# Patient Record
Sex: Female | Born: 1950 | Race: White | Hispanic: No | Smoking: Never smoker
Health system: Southern US, Community
[De-identification: ages and names within clinical notes are randomized; demographics above are authoritative.]

## PROBLEM LIST (undated history)

## (undated) DIAGNOSIS — G8929 Other chronic pain: Secondary | ICD-10-CM

## (undated) DIAGNOSIS — C4492 Squamous cell carcinoma of skin, unspecified: Secondary | ICD-10-CM

## (undated) DIAGNOSIS — M1990 Unspecified osteoarthritis, unspecified site: Secondary | ICD-10-CM

## (undated) DIAGNOSIS — Z973 Presence of spectacles and contact lenses: Secondary | ICD-10-CM

## (undated) DIAGNOSIS — J309 Allergic rhinitis, unspecified: Secondary | ICD-10-CM

## (undated) DIAGNOSIS — K219 Gastro-esophageal reflux disease without esophagitis: Secondary | ICD-10-CM

## (undated) DIAGNOSIS — I10 Essential (primary) hypertension: Secondary | ICD-10-CM

## (undated) DIAGNOSIS — M5135 Other intervertebral disc degeneration, thoracolumbar region: Secondary | ICD-10-CM

## (undated) DIAGNOSIS — G4733 Obstructive sleep apnea (adult) (pediatric): Secondary | ICD-10-CM

## (undated) DIAGNOSIS — G5132 Clonic hemifacial spasm, left: Secondary | ICD-10-CM

## (undated) DIAGNOSIS — E611 Iron deficiency: Secondary | ICD-10-CM

## (undated) DIAGNOSIS — Z8619 Personal history of other infectious and parasitic diseases: Secondary | ICD-10-CM

## (undated) DIAGNOSIS — Z87442 Personal history of urinary calculi: Secondary | ICD-10-CM

## (undated) DIAGNOSIS — E538 Deficiency of other specified B group vitamins: Secondary | ICD-10-CM

## (undated) DIAGNOSIS — D229 Melanocytic nevi, unspecified: Secondary | ICD-10-CM

## (undated) DIAGNOSIS — N200 Calculus of kidney: Secondary | ICD-10-CM

## (undated) HISTORY — PX: GASTRIC BYPASS: SHX52

## (undated) HISTORY — PX: REPLACEMENT TOTAL KNEE: SUR1224

## (undated) HISTORY — DX: Essential (primary) hypertension: I10

## (undated) HISTORY — DX: Squamous cell carcinoma of skin, unspecified: C44.92

## (undated) HISTORY — PX: KNEE ARTHROSCOPY: SUR90

## (undated) HISTORY — PX: BOTOX INJECTION: SHX5754

## (undated) HISTORY — PX: TOTAL KNEE ARTHROPLASTY: SHX125

## (undated) HISTORY — PX: ABDOMINAL HYSTERECTOMY: SHX81

## (undated) HISTORY — DX: Melanocytic nevi, unspecified: D22.9

## (undated) HISTORY — DX: Other chronic pain: G89.29

## (undated) HISTORY — PX: ANKLE SURGERY: SHX546

## (undated) HISTORY — DX: Unspecified osteoarthritis, unspecified site: M19.90

## (undated) HISTORY — PX: FACIAL COSMETIC SURGERY: SHX629

## (undated) HISTORY — DX: Personal history of other infectious and parasitic diseases: Z86.19

## (undated) HISTORY — DX: Deficiency of other specified B group vitamins: E53.8

---

## 1986-10-02 HISTORY — PX: TOTAL VAGINAL HYSTERECTOMY: SHX2548

## 1998-04-09 ENCOUNTER — Other Ambulatory Visit: Admission: RE | Admit: 1998-04-09 | Discharge: 1998-04-09 | Payer: Self-pay | Admitting: Obstetrics and Gynecology

## 1999-09-15 ENCOUNTER — Other Ambulatory Visit: Admission: RE | Admit: 1999-09-15 | Discharge: 1999-09-15 | Payer: Self-pay | Admitting: Obstetrics and Gynecology

## 2000-08-22 ENCOUNTER — Encounter: Payer: Self-pay | Admitting: Obstetrics and Gynecology

## 2000-08-22 ENCOUNTER — Encounter: Admission: RE | Admit: 2000-08-22 | Discharge: 2000-08-22 | Payer: Self-pay | Admitting: Obstetrics and Gynecology

## 2000-09-20 ENCOUNTER — Other Ambulatory Visit: Admission: RE | Admit: 2000-09-20 | Discharge: 2000-09-20 | Payer: Self-pay | Admitting: Obstetrics and Gynecology

## 2002-02-20 ENCOUNTER — Other Ambulatory Visit: Admission: RE | Admit: 2002-02-20 | Discharge: 2002-02-20 | Payer: Self-pay | Admitting: Obstetrics and Gynecology

## 2002-07-07 ENCOUNTER — Encounter: Admission: RE | Admit: 2002-07-07 | Discharge: 2002-07-07 | Payer: Self-pay | Admitting: *Deleted

## 2002-07-08 ENCOUNTER — Encounter: Admission: RE | Admit: 2002-07-08 | Discharge: 2002-07-15 | Payer: Self-pay | Admitting: *Deleted

## 2002-07-09 ENCOUNTER — Ambulatory Visit (HOSPITAL_BASED_OUTPATIENT_CLINIC_OR_DEPARTMENT_OTHER): Admission: RE | Admit: 2002-07-09 | Discharge: 2002-07-09 | Payer: Self-pay | Admitting: *Deleted

## 2002-08-15 ENCOUNTER — Encounter: Admission: RE | Admit: 2002-08-15 | Discharge: 2002-11-13 | Payer: Self-pay | Admitting: *Deleted

## 2002-08-25 ENCOUNTER — Inpatient Hospital Stay (HOSPITAL_COMMUNITY): Admission: RE | Admit: 2002-08-25 | Discharge: 2002-08-29 | Payer: Self-pay | Admitting: *Deleted

## 2002-08-25 DIAGNOSIS — Z9884 Bariatric surgery status: Secondary | ICD-10-CM

## 2002-08-25 HISTORY — DX: Bariatric surgery status: Z98.84

## 2002-08-25 HISTORY — PX: ROUX-EN-Y GASTRIC BYPASS: SHX1104

## 2003-03-03 ENCOUNTER — Encounter: Admission: RE | Admit: 2003-03-03 | Discharge: 2003-03-03 | Payer: Self-pay | Admitting: *Deleted

## 2003-03-05 ENCOUNTER — Encounter: Admission: RE | Admit: 2003-03-05 | Discharge: 2003-03-05 | Payer: Self-pay | Admitting: Geriatric Medicine

## 2003-03-05 ENCOUNTER — Encounter: Payer: Self-pay | Admitting: Geriatric Medicine

## 2003-10-03 HISTORY — PX: FACIAL COSMETIC SURGERY: SHX629

## 2004-04-20 ENCOUNTER — Encounter (INDEPENDENT_AMBULATORY_CARE_PROVIDER_SITE_OTHER): Payer: Self-pay | Admitting: *Deleted

## 2004-04-20 ENCOUNTER — Ambulatory Visit (HOSPITAL_COMMUNITY): Admission: RE | Admit: 2004-04-20 | Discharge: 2004-04-20 | Payer: Self-pay | Admitting: *Deleted

## 2004-04-20 HISTORY — PX: BREAST MASS EXCISION: SHX1267

## 2004-07-15 ENCOUNTER — Ambulatory Visit (HOSPITAL_BASED_OUTPATIENT_CLINIC_OR_DEPARTMENT_OTHER): Admission: RE | Admit: 2004-07-15 | Discharge: 2004-07-15 | Payer: Self-pay | Admitting: *Deleted

## 2004-07-15 ENCOUNTER — Encounter (INDEPENDENT_AMBULATORY_CARE_PROVIDER_SITE_OTHER): Payer: Self-pay | Admitting: *Deleted

## 2004-07-15 ENCOUNTER — Ambulatory Visit (HOSPITAL_COMMUNITY): Admission: RE | Admit: 2004-07-15 | Discharge: 2004-07-15 | Payer: Self-pay | Admitting: *Deleted

## 2004-07-15 HISTORY — PX: EXCISION OF BREAST LESION: SHX6676

## 2005-02-13 ENCOUNTER — Ambulatory Visit (HOSPITAL_COMMUNITY): Admission: RE | Admit: 2005-02-13 | Discharge: 2005-02-13 | Payer: Self-pay | Admitting: Plastic Surgery

## 2005-03-28 ENCOUNTER — Inpatient Hospital Stay (HOSPITAL_COMMUNITY): Admission: EM | Admit: 2005-03-28 | Discharge: 2005-03-29 | Payer: Self-pay | Admitting: Emergency Medicine

## 2005-03-28 HISTORY — PX: ABDOMINOPLASTY: SUR9

## 2005-03-28 HISTORY — PX: EXPLORATORY LAPAROTOMY: SUR591

## 2006-11-22 ENCOUNTER — Encounter: Admission: RE | Admit: 2006-11-22 | Discharge: 2006-11-22 | Payer: Self-pay | Admitting: Obstetrics and Gynecology

## 2008-04-22 ENCOUNTER — Encounter: Admission: RE | Admit: 2008-04-22 | Discharge: 2008-04-22 | Payer: Self-pay | Admitting: Specialist

## 2008-06-24 DIAGNOSIS — D229 Melanocytic nevi, unspecified: Secondary | ICD-10-CM

## 2008-06-24 HISTORY — DX: Melanocytic nevi, unspecified: D22.9

## 2008-07-20 ENCOUNTER — Encounter: Admission: RE | Admit: 2008-07-20 | Discharge: 2008-07-20 | Payer: Self-pay | Admitting: Geriatric Medicine

## 2009-08-31 ENCOUNTER — Inpatient Hospital Stay (HOSPITAL_COMMUNITY): Admission: RE | Admit: 2009-08-31 | Discharge: 2009-09-03 | Payer: Self-pay | Admitting: Specialist

## 2009-11-02 ENCOUNTER — Encounter: Admission: RE | Admit: 2009-11-02 | Discharge: 2009-11-02 | Payer: Self-pay | Admitting: Geriatric Medicine

## 2010-08-12 ENCOUNTER — Inpatient Hospital Stay (HOSPITAL_COMMUNITY): Admission: RE | Admit: 2010-08-12 | Discharge: 2010-08-15 | Payer: Self-pay | Admitting: Specialist

## 2010-10-23 ENCOUNTER — Encounter: Payer: Self-pay | Admitting: Geriatric Medicine

## 2010-10-24 ENCOUNTER — Encounter: Payer: Self-pay | Admitting: Geriatric Medicine

## 2010-12-13 LAB — CBC
HCT: 28.8 % — ABNORMAL LOW (ref 36.0–46.0)
HCT: 40.7 % (ref 36.0–46.0)
Hemoglobin: 10.3 g/dL — ABNORMAL LOW (ref 12.0–15.0)
Hemoglobin: 13.7 g/dL (ref 12.0–15.0)
Hemoglobin: 9.9 g/dL — ABNORMAL LOW (ref 12.0–15.0)
MCH: 29.8 pg (ref 26.0–34.0)
MCH: 30.1 pg (ref 26.0–34.0)
MCHC: 34.1 g/dL (ref 30.0–36.0)
MCHC: 34.3 g/dL (ref 30.0–36.0)
MCHC: 34.9 g/dL (ref 30.0–36.0)
MCV: 87.8 fL (ref 78.0–100.0)
MCV: 87.9 fL (ref 78.0–100.0)
Platelets: 146 10*3/uL — ABNORMAL LOW (ref 150–400)
Platelets: 222 10*3/uL (ref 150–400)
RBC: 3.28 MIL/uL — ABNORMAL LOW (ref 3.87–5.11)
RBC: 3.35 MIL/uL — ABNORMAL LOW (ref 3.87–5.11)
RBC: 4.6 MIL/uL (ref 3.87–5.11)
RDW: 13.5 % (ref 11.5–15.5)
RDW: 13.6 % (ref 11.5–15.5)
RDW: 13.8 % (ref 11.5–15.5)
WBC: 10.9 10*3/uL — ABNORMAL HIGH (ref 4.0–10.5)
WBC: 6.2 10*3/uL (ref 4.0–10.5)
WBC: 6.9 10*3/uL (ref 4.0–10.5)

## 2010-12-13 LAB — BASIC METABOLIC PANEL
BUN: 6 mg/dL (ref 6–23)
Calcium: 8.2 mg/dL — ABNORMAL LOW (ref 8.4–10.5)
Creatinine, Ser: 0.78 mg/dL (ref 0.4–1.2)
GFR calc Af Amer: >60 mL/min (ref >60)
GFR calc Af Amer: >60 mL/min (ref >60)
GFR calc non Af Amer: >60 mL/min (ref >60)
Potassium: 3.7 mEq/L (ref 3.5–5.1)
Potassium: 4.3 mEq/L (ref 3.5–5.1)
Sodium: 136 mEq/L (ref 135–145)

## 2010-12-13 LAB — URINALYSIS, ROUTINE W REFLEX MICROSCOPIC
Glucose, UA: NEGATIVE mg/dL
Hgb urine dipstick: NEGATIVE
Protein, ur: NEGATIVE mg/dL
pH: 5.5 (ref 5.0–8.0)

## 2010-12-13 LAB — CROSSMATCH
ABO/RH(D): O POS
Unit division: 0

## 2010-12-13 LAB — SURGICAL PCR SCREEN: MRSA, PCR: NEGATIVE

## 2010-12-13 LAB — COMPREHENSIVE METABOLIC PANEL
BUN: 13 mg/dL (ref 6–23)
CO2: 29 mEq/L (ref 19–32)
Calcium: 8.6 mg/dL (ref 8.4–10.5)
Chloride: 107 mEq/L (ref 96–112)
Creatinine, Ser: 0.82 mg/dL (ref 0.4–1.2)
GFR calc non Af Amer: >60 mL/min (ref >60)
Glucose, Bld: 74 mg/dL (ref 70–99)
Total Bilirubin: 0.4 mg/dL (ref 0.3–1.2)

## 2010-12-13 LAB — PROTIME-INR
INR: 0.91 (ref 0.00–1.49)
Prothrombin Time: 12.5 seconds (ref 11.6–15.2)

## 2010-12-13 LAB — DIFFERENTIAL
Basophils Relative: 1 % (ref 0–1)
Eosinophils Relative: 5 % (ref 0–5)
Neutro Abs: 4.6 10*3/uL (ref 1.7–7.7)

## 2010-12-13 LAB — APTT: aPTT: 36 seconds (ref 24–37)

## 2011-01-03 LAB — CBC
HCT: 30.8 % — ABNORMAL LOW (ref 36.0–46.0)
Hemoglobin: 10.4 g/dL — ABNORMAL LOW (ref 12.0–15.0)
Hemoglobin: 9.5 g/dL — ABNORMAL LOW (ref 12.0–15.0)
MCHC: 33.1 g/dL (ref 30.0–36.0)
MCHC: 33.8 g/dL (ref 30.0–36.0)
MCV: 87.6 fL (ref 78.0–100.0)
Platelets: 140 10*3/uL — ABNORMAL LOW (ref 150–400)
RBC: 3.5 MIL/uL — ABNORMAL LOW (ref 3.87–5.11)
RDW: 14.2 % (ref 11.5–15.5)
WBC: 5.7 10*3/uL (ref 4.0–10.5)
WBC: 6.3 10*3/uL (ref 4.0–10.5)
WBC: 9.3 10*3/uL (ref 4.0–10.5)

## 2011-01-03 LAB — BASIC METABOLIC PANEL
BUN: 8 mg/dL (ref 6–23)
CO2: 29 mEq/L (ref 19–32)
Chloride: 102 mEq/L (ref 96–112)
Chloride: 106 mEq/L (ref 96–112)
Creatinine, Ser: 0.67 mg/dL (ref 0.4–1.2)
GFR calc Af Amer: >60 mL/min (ref >60)
Glucose, Bld: 132 mg/dL — ABNORMAL HIGH (ref 70–99)
Glucose, Bld: 133 mg/dL — ABNORMAL HIGH (ref 70–99)

## 2011-01-04 LAB — URINE MICROSCOPIC-ADD ON

## 2011-01-04 LAB — URINALYSIS, ROUTINE W REFLEX MICROSCOPIC
Bilirubin Urine: NEGATIVE
Nitrite: NEGATIVE
Protein, ur: NEGATIVE mg/dL
Urobilinogen, UA: 1 mg/dL (ref 0.0–1.0)

## 2011-01-04 LAB — COMPREHENSIVE METABOLIC PANEL
ALT: 24 U/L (ref 0–35)
Alkaline Phosphatase: 82 U/L (ref 39–117)
BUN: 11 mg/dL (ref 6–23)
Chloride: 104 mEq/L (ref 96–112)
Glucose, Bld: 79 mg/dL (ref 70–99)
Potassium: 4.2 mEq/L (ref 3.5–5.1)
Sodium: 143 mEq/L (ref 135–145)
Total Bilirubin: 0.6 mg/dL (ref 0.3–1.2)
Total Protein: 6.8 g/dL (ref 6.0–8.3)

## 2011-01-04 LAB — CBC
HCT: 40.7 % (ref 36.0–46.0)
Hemoglobin: 13.5 g/dL (ref 12.0–15.0)
RBC: 4.59 MIL/uL (ref 3.87–5.11)
WBC: 6.6 10*3/uL (ref 4.0–10.5)

## 2011-01-04 LAB — DIFFERENTIAL
Basophils Absolute: 0 10*3/uL (ref 0.0–0.1)
Basophils Relative: 0 % (ref 0–1)
Eosinophils Absolute: 0.3 10*3/uL (ref 0.0–0.7)
Monocytes Absolute: 0.5 10*3/uL (ref 0.1–1.0)
Monocytes Relative: 8 % (ref 3–12)
Neutro Abs: 4.1 10*3/uL (ref 1.7–7.7)
Neutrophils Relative %: 61 % (ref 43–77)

## 2011-01-04 LAB — APTT: aPTT: 32 seconds (ref 24–37)

## 2011-01-04 LAB — CROSSMATCH: Antibody Screen: NEGATIVE

## 2011-02-17 NOTE — Op Note (Signed)
NAME:  [name], [name]                          ACCOUNT NO.:  [id number]   MEDICAL RECORD NO.:  [id number]                   PATIENT TYPE:  OIB   LOCATION:  NA                                   FACILITY:  [facility]   PHYSICIAN:  Vikki Ports, M.D.         DATE OF BIRTH:  [birth year]   DATE OF PROCEDURE:  04/20/2004  DATE OF DISCHARGE:                                 OPERATIVE REPORT   PREOPERATIVE DIAGNOSES:  1. Left breast mass.  2. Right back mass.   POSTOPERATIVE DIAGNOSES:  1. Left breast mass.  2. Right back mass.   PROCEDURE:  1. Excisional left breast biopsy.  2. Excision of 14-cm right back mass.   SURGEON:  Vikki Ports, M.D.   ANESTHESIA:  General.   DESCRIPTION OF PROCEDURE:  The patient was taken to the operating room and  placed in a supine position.  After adequate general anesthesia was induced  using endotracheal tube, the left breast was prepped and draped in normal  sterile fashion.  Using an elliptical incision overlying the 9 o'clock  region of the left breast, I excised the cutaneous and subcutaneous portion  of the lesion en bloc; it was sent for pathologic evaluation.  Adequate  hemostasis was ensured and the skin was closed with a subcuticular 4-0  Monocryl.   The patient was then placed in the left lateral decubitus position and the  right posterior back was prepped and draped in normal sterile fashion.  Using an oblique incision along the skin crease line, I dissected down to  subcutaneous fat into a well-encapsulated lipoma.  This was excised in its  entirety, adequate hemostasis was ensured and the skin here was also closed  with a running 4-0 subcuticular Monocryl.  Steri-Strips and sterile  dressings were applied.  The patient tolerated the procedure well and went  to PACU in good condition.                                               Vikki Ports, M.D.    KRH/MEDQ  D:  04/20/2004  T:  04/20/2004  Job:  [id number]

## 2011-02-17 NOTE — Op Note (Signed)
[name], BRASE                          ACCOUNT NO.:  [id number]   MEDICAL RECORD NO.:  [id number]                   PATIENT TYPE:  INP   LOCATION:  0466                                 FACILITY:  [facility]   PHYSICIAN:  Vikki Ports, M.D.         DATE OF BIRTH:  09-17-1951   DATE OF PROCEDURE:  08/25/2002  DATE OF DISCHARGE:                                 OPERATIVE REPORT   PREOPERATIVE DIAGNOSES:  1. Morbid obesity.  2. Hypertension.  3. Sleep apnea.   POSTOPERATIVE DIAGNOSES:  1. Morbid obesity.  2. Hypertension.  3. Sleep apnea.   PROCEDURE:  Laparoscopic Roux-en-Y gastric bypass (retrocolic,  retrogastric).   SURGEON:  Vikki Ports, M.D.   ASSISTANT:  Sandria Bales. Ezzard Standing, M.D.   ANESTHESIA:  General.   DESCRIPTION OF PROCEDURE:  The patient was taken to the operating room and  placed in a supine position after adequate anesthesia was induced using an  endotracheal tube.  The patient's abdomen and chest were prepped and draped  in a normal sterile fashion.  Using a Veress needle in the left upper  quadrant, the peritoneum was entered.  Pneumoperitoneum was accomplished  with continuous high-flow carbon dioxide through the Veress needle.  Using  the Optiview device and a 0-degree scope, a 12 mm trocar was placed in the  left upper quadrant.  Under direct visualization, a 12 mm trocar was placed  in the midline in the supraumbilical region, another one in the right upper  quadrant, and a second one in the right midabdomen.  A 5 mm trocar was  placed in the left lateral abdomen.  The abdomen was surveyed.  There  appeared to be very generous omentum.  This was reflected superiorly.  The  transverse mesocolon was identified, and the duodenum at the ligament of  Treitz was identified.  Distally, approximately 40 cm from the ligament of  Treitz, the jejunum was divided with a white load laparoscopic GIA stapling  device.  The mesentery was taken down  with two fires of the GIA white load  stapling device.  We had good pulses in both the proximal and distal  mesentery.  The distal bowel was marked by attaching a 0.25-inch Penrose  drain with a silk suture.  There appeared to be some mild ischemic changes  to the distal segment.  We then measured 100 cm from this division down to  the jejunum.  Orienting this appropriately so that there was no twist in the  mesentery, a side-to-side functional end-to-end anastomosis was created  after enterotomies were placed in the afferent limb.  This was accomplished  with a white load GIA stapling device.  The enterotomy was closed with a  running 2-0 Vicryl suture.  The Penrose marked proximal portion of the limb  was identified; again, there was no twist in the mesentery.  The ligament of  Treitz was again identified, and reflecting the  mesocolon anteriorly, a  window was created just anterior and left lateral to the ligament of the  Treitz.  This was a very difficult dissection secondary to the patient's  obesity and large omentum.  On multiple passes, the Penrose was passed up  and through this window.   We then turned our attention to the upper abdomen.  A liver retractor was  placed through a 5 mm incision in the right upper quadrant.  The liver was  retracted anteriorly.  The angle of His was identified, and attachments were  taken down.  A small rent was made in the splenic capsule and was controlled  with Surgicel.  This was accomplished using the Harmonic scalpel in the  angle of His.   I then turned my attention to the lesser curve.  Between the first and  second vessels, a window in the gastrohepatic ligament was identified.  Dissecting posterior to the stomach, a window was created into the lesser  sac.  Again, the Penrose drain was not identified.  The stomach was then  divided using five blue loads of the GIA stapling device.  This was  accomplished with a 34 [place] Ewald tube in the  proximal stomach.  The pouch  was approximately 20 cc in size.  Again, the Penrose drain was not  visualized.   For this reason, the lesser sac was then entered through the gastrocolic  ligament.  An avascular plane was opened, and again the Penrose drain was  not visualized.  We then went back down below at the window in the  transverse mesocolon and passed the Penrose drain a third time.  At this  point, the Penrose drain was visualized up near the proximal pouch of the  stomach.  The proximal portion of duodenum was mobilized up next to the  pouch and was under no tension, approximately 4 cm from the proximal bowel,  somewhat ischemic, and therefore, was resected using a white load GIA  stapling device.  A two-layered gastrojejunostomy was then performed with a  running 2-0 silk suture with firing of a blue load of the GIA stapling  device.  This was oversewn internally with a running full thickness 2-0  Vicryl suture.  Interior serosal closure was then accomplished.  This was  performed over a 34 Ewald tube.  Endoscopy was then performed with saline in  the upper abdomen showing no evidence of bubbles or leak and good  visualization of the anastomosis in the small bowel.  The endoscope was  removed.  The small bowel was decompressed.  The small bowel mesenteric  defect had previously been closed with a running 3-0 silk suture, and the  Petersen's defect was then closed by tacking the proximal duodenum to the  transverse mesocolon with a four-way silk stitch.  The afferent limb was  then tacked to the Roux limb approximately 5 cm distal to this with a simple  0 silk suture.  A drain was placed near the gastrojejunostomy.  Adequate  hemostasis was then again ensured.  The fascial defects were closed with 0  silk sutures using Storz suture passer.  The skin incisions were closed with staples.  The patient tolerated the procedure well and went to PACU in good  condition.  Vikki Ports, M.D.    KRH/MEDQ  D:  08/26/2002  T:  08/27/2002  Job:  [id number]   cc:   Hal T. Stoneking, M.D.  [address] [place], [place] [address]  Fax: [phone]

## 2011-02-17 NOTE — Op Note (Signed)
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   PROCEDURE:  Re-excision of left breast lesion.   SURGEON:  Vikki Ports, M.D.   ANESTHESIA:  Local MAC.   DESCRIPTION OF PROCEDURE:  The patient was taken to the operating room and  placed in a supine position.  After adequate anesthesia was induced using  MAC technique, the left breast was prepped and draped in the normal sterile  fashion.  Under 1% lidocaine local anesthesia, the skin underlying the  previous incision site was anesthetized, an elliptical incision was made,  the wound was closed with subcuticular 4-0 Monocryl.  Steri-Strips and  sterile dressings were applied.  The patient tolerated the procedure well  and went to the PACU in good condition.       KRH/MEDQ  D:  07/15/2004  T:  07/15/2004  Job:  [id number]

## 2011-02-17 NOTE — Op Note (Signed)
[name], OZAWA                ACCOUNT NO.:  [id number]   MEDICAL RECORD NO.:  [id number]          PATIENT TYPE:  INP   LOCATION:  0102                         FACILITY:  [facility]   PHYSICIAN:  Alfredia Ferguson, M.D.  DATE OF BIRTH:  [birth year]   DATE OF PROCEDURE:  03/28/2005  DATE OF DISCHARGE:                                 OPERATIVE REPORT   PREOPERATIVE DIAGNOSIS:  Postoperative bleeding following abdominoplasty  performed earlier in the day.   POSTOPERATIVE DIAGNOSIS:  Postoperative bleeding following abdominoplasty  performed earlier in the day.   OPERATION PERFORMED:  Exploration of abdominoplasty incision and evacuation  of 600 cc blood clot and cauterization of several small vessels.   SURGEON:  Alfredia Ferguson, M.D.   ANESTHESIA:  General endotracheal anesthesia.   INDICATIONS FOR SURGERY:  This is a 59 year old woman who underwent an  abdominoplasty with anterior rectus fascial plication earlier today in my  office.  Her intraoperative course was completely uncomplicated.  She had  almost no bleeding during the surgery.  In the recovery room, towards the  end of her recovery when she was preparing to go home, the patient began to  feel lightheaded.  She had a drop in her blood pressure.  Physical exam  revealed some swelling in the left side of her abdomen.  I surmised that the  patient was bleeding postoperatively, and because of the change in blood  pressure, opted to transfer her to [facility].  During the  transfer process, the patient's blood pressure remained low at approximately  100/50.  The swelling did not significantly change, but the amount of blood  coming through her J-P drain was approximately 120-150 cc during a one-hour  period.  For this reason, I opted to take the patient straight from the  emergency room to the operating room for exploration of the abdomen.   DESCRIPTION OF SURGERY:  After adequate general endotracheal anesthesia had  been induced, the patient's abdomen was prepped with Betadine and draped  with sterile drapes.  I removed the sutures from approximately 3-4 cm to the  right of the midline all the way to the left lateral extent of my incision.  This opened the wound approximately 75% of the original incision.  I noted  that there was a very large blood clot located in the left side of the  abdomen.  This clot was evacuated.  There was also some liquid blood.  The  abdominal wound was completely packed off with lap tapes.  They were removed  one at a time, inspecting for bleeding.  There was never any vessel which  had brisk bleeding.  Identified about two small vessels, which were  nonpulsatile vessels in the upper portion of the abdomen just to the left of  midline.  These were cauterized.  One of them was oversewn with a figure-of-  eight Vicryl suture.  The remainder of the abdominal wound had very minimal  capillary oozing, which would be consistent with reopening of fresh wound.  I irrigated the abdominal wound with approximately 2-3 liters of saline.  I  kept the abdominal wound packed off on several occasions for several  minutes, removing them one at a time, without identifying any further  bleeding.  I introduced some thrombin to the abdominal wall surface to  insure to minimize the chance of any further bleeding from a capillary ooze.  The wound again was inspected for at least 30 minutes before closing.  Two  Blake drains were left in the wound from the original surgery.  The patient  also had a pain pump left in from the original surgery.  The abdominal wound  was now closed with multiple interrupted 2-0 Vicryl sutures from Scarpa's'  fascia followed by interrupted 3-0 Monocryl sutures for the dermis, followed  by 3-0 Monocryl running subcuticular to the skin edges.  Estimated blood  loss from the surgery was approximately 300 cc of the original clot, which I  evacuated, and probably about an  additional 100 cc of bleeding.  The patient  was awakened, extubated, and transported to the recovery room in  satisfactory condition.  Abdominal dressing was placed over the incision,  followed by an abdominal binder under moderate pressure.  The patient's  blood pressure at the completion of the procedure was within normal limits.  The patient was taken to the recovery room in satisfactory condition.      Alfredia Ferguson, M.D.  Electronically Signed     WBB/MEDQ  D:  03/28/2005  T:  03/28/2005  Job:  [id number]

## 2011-02-17 NOTE — Discharge Summary (Signed)
   [name], [name]                          ACCOUNT NO.:  [id number]   MEDICAL RECORD NO.:  [id number]                   PATIENT TYPE:  INP   LOCATION:  0466                                 FACILITY:  [facility]   PHYSICIAN:  Vikki Ports, M.D.         DATE OF BIRTH:  12/03/1950   DATE OF ADMISSION:  08/25/2002  DATE OF DISCHARGE:  08/29/2002                                 DISCHARGE SUMMARY   ADMISSION DIAGNOSES:  1. Morbid obesity.  2. Obstructive sleep apnea.  3. Hypertension.   DISCHARGE DIAGNOSES:  1. Morbid obesity.  2. Obstructive sleep apnea.  3. Hypertension.   CONDITION ON DISCHARGE:  Good and improved.   DISPOSITION:  Discharged to home.   HISTORY OF PRESENT ILLNESS:  The patient is a 60 year old morbidly obese  white female who presents for laparoscopic Roux-en-Y gastric bypass as  treatment for her morbid obesity.  For remainder of the H&P, please see the  chart.   HOSPITAL COURSE:  The patient was admitted and taken to the operating room  where she underwent laparoscopic Roux-en-Y gastric bypass.  She was  monitored in the intensive care unit overnight.  She did very well.  She  underwent Gastrografin swallow on postoperative day #1 which showed no  evidence of leak and good emptying of the stomach pouch.  Her diet was  slowly advanced to sugar-free Cool-Aid and Glucerna.  She was tolerating  p.o. pain medicines, having bowel movements, ambulating in the halls, and  was ready for discharge on postoperative day #4.   DISCHARGE MEDICATIONS:  1. Roxicet elixir for pain.  2. Hydrochlorothiazide 25 mg a day.                                               Vikki Ports, M.D.    KRH/MEDQ  D:  08/29/2002  T:  08/29/2002  Job:  [id number]

## 2011-02-17 NOTE — Discharge Summary (Signed)
[name], [name]                ACCOUNT NO.:  [id number]   MEDICAL RECORD NO.:  [id number]          PATIENT TYPE:  INP   LOCATION:  1305                         [facility]ACILITY:  [facility]   PHYSICIAN:  Alfredia Ferguson, M.D.  DATE OF BIRTH:  1950-11-06   DATE OF ADMISSION:  03/28/2005  DATE OF DISCHARGE:  03/29/2005                                 DISCHARGE SUMMARY   ADMISSION DIAGNOSIS:  Postoperative complications, bleeding from operative  site.   DISCHARGE DIAGNOSIS:  Postoperative complications, bleeding from operative  site.   OPERATION PERFORMED:  Exploration of abdominoplasty site and evacuation of  hematoma, and cauterization of blood vessel.   CHIEF COMPLAINT:  I am bleeding.   HISTORY OF PRESENT ILLNESS:  This is a 60 year old woman who underwent an  abdominoplasty on March 28, 2005 in my office. She is a gastric bypass  patient who has lost approximately 130 pounds and had an abdominal  panniculus. The patient's intraoperative course was completely uneventful  with almost no bleeding. Towards the end of her recovery, in the recovery  room at my office, the patient developed some feeling of nausea and  vomiting, light headedness and a drop in her blood pressure from around 140  systolic to approximately 80 systolic. The patient's blood pressure quickly  recovered with IV fluids, back up to well above 100. It was noted that there  was active bleeding coming into her Al Pimple drain. She filled the  drain bulb approximately three times within 15 or 20 minutes. Although there  was no significant abdominal swelling, it was fairly obvious in the office  that the patient had developed some postoperative bleeding. The patient was  transported to [facility] emergency room via ambulance. The patient  continued to have a blood pressure of around 100 systolic with very mild  tachycardia. The patient was taken directly from the emergency room to the  operating room.   PAST MEDICAL  HISTORY:  Significant for obesity and hypertension.   PAST SURGICAL HISTORY:  Includes gastric bypass procedure, recent face lift,  and blepharoplasty.   ALLERGIES:  PENICILLIN AND LORCET.   ADMISSION LABORATORY VALUES:  Include a CBC with a hematocrit of 29. EKG was  normal.   HOSPITAL COURSE:  The patient was taken emergently from the emergency room  to the operating room where she underwent exploration of her abdominoplasty  site. There was noted intraoperatively that the patient had a hematoma of  approximately 500 to 600 cc. Hematoma was evacuated. There was no one single  bleeding vessel identified. There was some diffuse oozing consistent with  somebody who had a recent incision which has been opened soon after surgery.  The wound was copiously irrigated with warm saline irrigation, any blood  vessel which showed any sign of oozing was cauterized. Some Thrombin was  placed inside the abdominal wound.  Two Jackson-Pratt drains left in place.  The incision was closed and the patient was admitted for observation. The  patient experienced no further bleeding. Her hemoglobin stabilized on the  first postoperative day at approximately 8 with a hematocrit  around 24. She  had minimal drainage from her drains. Her abdominal dressing is dry and  intact with no evidence of any bleeding. Her abdominal girth remains stable  from yesterday with no evidence of any swelling or recurrence of a hematoma.  The patient is being discharged on the first postoperative day. She has been  instructed to ambulate several times per day, empty her drains and call me  if there are any concerns regarding further bleeding. Postoperative follow-  up will be provided in 24 hours in my office.   DISCHARGE MEDICATIONS:  Tylox and Keflex.      Alfredia Ferguson, M.D.  Electronically Signed     WBB/MEDQ  D:  03/29/2005  T:  03/29/2005  Job:  [id number]

## 2011-02-17 NOTE — Op Note (Signed)
   NAME:  [name], [name]                          ACCOUNT NO.:  [id number]   MEDICAL RECORD NO.:  [id number]                   PATIENT TYPE:  INP   LOCATION:  0159                                 FACILITY:  [facility]   PHYSICIAN:  Sandria Bales. Ezzard Standing, M.D.               DATE OF BIRTH:  [birth year]   DATE OF PROCEDURE:  08/25/2002  DATE OF DISCHARGE:                                 OPERATIVE REPORT   PREOPERATIVE DIAGNOSES:  Morbid obesity, gastrojejunostomy.   POSTOPERATIVE DIAGNOSES:  Morbid obesity, normal gastrojejunostomy with no  evidence of leak.   PROCEDURE:  Esophagogastroscopy.   SURGEON:  Sandria Bales. Ezzard Standing, M.D.   FIRST ASSISTANT:  None.   ANESTHESIA:  General endotracheal.   ESTIMATED BLOOD LOSS:  Minimal.   INDICATIONS FOR PROCEDURE:  [name] is a 60 year old white female whose  undergoing a morbid obesity operation by Dr. Danna Hefty with a  laparoscopic Roux-en-Y gastrojejunostomy. We are doing the upper endoscopy  to check both her gastric anastomosis and make sure there is no leak.   DESCRIPTION OF PROCEDURE:  A flexible Olympus endoscope is passed without  difficulty down the back of the throat into her stomach remnant. Her GE  junction was noted at 40 cm. The GE Roux-Y jejunal limb or the  gastrojejunostomy was about 4 cm along the lesser curvature. I could see the  anastomosis and I see small bowel. I cleaned blood off with irrigating fluid  and could see the staple line along the gastric wall with no evidence of  leak and no evidence of any kind of compromise to the bowel wall. We  aspirated the air back and scope was then withdrawn without difficulty. The  patient tolerated the procedure well. Dr. Luan Pulling will dictate the major  portion of this Roux-Y operation but the endoscopy went well with no  evidence of complications.                                               Sandria Bales. Ezzard Standing, M.D.    DHN/MEDQ  D:  08/25/2002  T:  08/25/2002  Job:   [id number]   cc:   Vikki Ports, M.D.  [address], [address]  [facility] [place]  [place] [address]  Fax: [phone]

## 2011-12-27 ENCOUNTER — Ambulatory Visit
Admission: RE | Admit: 2011-12-27 | Discharge: 2011-12-27 | Disposition: A | Discharge status: Home / Self Care | Payer: BC Managed Care – PPO | Source: Ambulatory Visit | Attending: Geriatric Medicine | Admitting: Geriatric Medicine

## 2011-12-27 ENCOUNTER — Other Ambulatory Visit: Payer: Self-pay | Admitting: Geriatric Medicine

## 2011-12-27 DIAGNOSIS — R52 Pain, unspecified: Secondary | ICD-10-CM

## 2012-05-14 ENCOUNTER — Other Ambulatory Visit: Payer: Self-pay | Admitting: Geriatric Medicine

## 2012-05-14 DIAGNOSIS — Z1231 Encounter for screening mammogram for malignant neoplasm of breast: Secondary | ICD-10-CM

## 2012-06-04 ENCOUNTER — Ambulatory Visit
Admission: RE | Admit: 2012-06-04 | Discharge: 2012-06-04 | Disposition: A | Discharge status: Home / Self Care | Payer: BC Managed Care – PPO | Source: Ambulatory Visit | Attending: Geriatric Medicine | Admitting: Geriatric Medicine

## 2012-06-04 DIAGNOSIS — Z1231 Encounter for screening mammogram for malignant neoplasm of breast: Secondary | ICD-10-CM

## 2013-06-05 ENCOUNTER — Other Ambulatory Visit: Payer: Self-pay

## 2013-06-05 DIAGNOSIS — Z1231 Encounter for screening mammogram for malignant neoplasm of breast: Secondary | ICD-10-CM

## 2013-06-26 ENCOUNTER — Ambulatory Visit
Admission: RE | Admit: 2013-06-26 | Discharge: 2013-06-26 | Disposition: A | Discharge status: Home / Self Care | Payer: BC Managed Care – PPO | Source: Ambulatory Visit

## 2013-06-26 DIAGNOSIS — Z1231 Encounter for screening mammogram for malignant neoplasm of breast: Secondary | ICD-10-CM

## 2014-03-03 ENCOUNTER — Encounter: Payer: Self-pay | Admitting: Neurology

## 2014-03-10 ENCOUNTER — Encounter: Payer: Self-pay | Admitting: Neurology

## 2014-03-10 ENCOUNTER — Ambulatory Visit: Payer: BC Managed Care – PPO | Admitting: Neurology

## 2014-03-10 ENCOUNTER — Ambulatory Visit (INDEPENDENT_AMBULATORY_CARE_PROVIDER_SITE_OTHER): Payer: BC Managed Care – PPO | Admitting: Neurology

## 2014-03-10 VITALS — BP 130/70 | HR 78 | Ht 63.0 in | Wt 186.0 lb

## 2014-03-10 DIAGNOSIS — G518 Other disorders of facial nerve: Secondary | ICD-10-CM

## 2014-03-10 DIAGNOSIS — H02409 Unspecified ptosis of unspecified eyelid: Secondary | ICD-10-CM

## 2014-03-10 DIAGNOSIS — G5139 Clonic hemifacial spasm, unspecified: Secondary | ICD-10-CM

## 2014-03-10 DIAGNOSIS — H02402 Unspecified ptosis of left eyelid: Secondary | ICD-10-CM

## 2014-03-10 NOTE — Patient Instructions (Signed)
1. We have scheduled you at [facility] for your MRI on 03/27/14 at 12:00pm. Please arrive 15 minutes prior and go to 1st floor radiology. If you need to reschedule for any reason please call [phone]. 2. Your provider has requested that you have labwork completed today. Please go to [facility] on the first floor of this building before leaving the office today.

## 2014-03-10 NOTE — Progress Notes (Signed)
[name] was seen today in neurologic consultation at the request of Mathews Argyle, MD.  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 year old female with a history of L eye twitching x the last few months; her daughter mentioned it to her about 4-6 weeks ago.  Once other people mentioned it to her, she could then feel it pulling even though she was a little aware prior to that.  It doesn't hurt.  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  There is never a time where she cannot get the eye open but she states that there is a time when the eyes are asymmetric and the L is droopy.  She has no trouble climbing stairs except she has had bilateral knee replacement causing pain with climbing stairs.  No diplopia.  No difficulty swallowing or voice changes.  She is unsure if the cheek twitches but thinks that it might.      Neuroimaging has not previously been performed.   PREVIOUS MEDICATIONS: n/a  ALLERGIES:   Allergies  Allergen Reactions  . Lorcet [Hydrocodone-Acetaminophen]   . Penicillins   . Sulfa Antibiotics     CURRENT MEDICATIONS:  Current Outpatient Prescriptions on File Prior to Visit  Medication Sig Dispense Refill  . Calcium Carbonate-Vitamin D (CALCIUM + D PO) Take by mouth.      . cetirizine (ZYRTEC) 10 MG tablet Take 10 mg by mouth daily.      . Cyanocobalamin (VITAMIN B-12 PO) Take 5,000 Units by mouth daily.      . felodipine (PLENDIL) 5 MG 24 hr tablet Take 5 mg by mouth daily.      . Ferrous Sulfate (IRON) 28 MG TABS Take by mouth.      . fluticasone (FLONASE) 50 MCG/ACT nasal spray Place into both nostrils daily.      Marland Kitchen gabapentin (NEURONTIN) 600 MG tablet Take 600 mg by mouth 2 (two) times daily.       Marland Kitchen lidocaine (LIDODERM) 5 % Place 1 patch onto the skin daily. Remove & Discard patch within 12 hours or as directed by MD       . lisinopril (PRINIVIL,ZESTRIL) 10 MG tablet Take 10 mg by mouth daily.      Marland Kitchen LORazepam (ATIVAN) 0.5 MG tablet Take 0.5 mg by mouth every 8 (eight) hours.      . meloxicam (MOBIC) 7.5 MG tablet Take 7.5 mg by mouth daily.      . Multiple Vitamin (MULTIVITAMIN) tablet Take 1 tablet by mouth daily.      . traMADol (ULTRAM) 50 MG tablet Take by mouth every 6 (six) hours as needed.      . zolpidem (AMBIEN) 5 MG tablet Take 5 mg by mouth at bedtime as needed for sleep.       No current facility-administered medications on file prior to visit.    PAST MEDICAL HISTORY:   Past Medical History  Diagnosis Date  . Hypertension   . B12 deficiency   . Chronic pain   . Osteoarthritis   . History of shingles     PAST SURGICAL HISTORY:   Past Surgical History  Procedure Laterality Date  . Replacement total knee Bilateral   . Gastric bypass    . Abdominal hysterectomy    . Ankle surgery Right   . Facial cosmetic surgery    .  Botox injection      facial    SOCIAL HISTORY:   History   Social History  . Marital Status: Widowed    Spouse Name: N/A    Number of Children: N/A  . Years of Education: N/A   Occupational History  .      HS principle   Social History Main Topics  . Smoking status: Never Smoker   . Smokeless tobacco: Not on file  . Alcohol Use: Yes     Comment: 1-2 times per month  . Drug Use: No  . Sexual Activity: Not on file   Other Topics Concern  . Not on file   Social History Narrative  . No narrative on file    FAMILY HISTORY:   Family Status  Relation Status Death Age  . Brother Deceased     leukemia  . Father Deceased     heart disease, stroke  . Mother Alive     mobility issues  . Sister Alive     healthy, OA  . Brother Alive     gout  . Son Alive     healthy  . Daughter Alive     addiction issues    ROS:  A complete 10 system review of systems was obtained and was unremarkable apart from what is mentioned above.  PHYSICAL  EXAMINATION:    VITALS:   Filed Vitals:   03/10/14 1252  BP: 130/70  Pulse: 78  Height: 5\' 3"  (1.6 m)  Weight: 186 lb (84.369 kg)    GEN:  Normal appears female in no acute distress.  Appears stated age. HEENT:  Normocephalic, atraumatic. The mucous membranes are moist. The superficial temporal arteries are without ropiness or tenderness. Cardiovascular: Regular rate and rhythm. Lungs: Clear to auscultation bilaterally. Neck/Heme: There are no carotid bruits noted bilaterally.  NEUROLOGICAL: Orientation:  The patient is alert and oriented x 3.  Fund of knowledge is appropriate.  Recent and remote memory intact.  Attention span and concentration normal.  Repeats and names without difficulty. Cranial nerves: There is decreased NL fold on the L that is just minor.  Minor L ptosis that fluctuates but does not change with ice test.  There is rare L hemifacial spasm primarily involving L orbicularis oculi.  Forehead wrinkle symmetric (but has had botox previously).   The pupils are equal round and reactive to light bilaterally. Fundoscopic exam reveals clear disc margins bilaterally. Extraocular muscles are intact and visual fields are full to confrontational testing. Speech is fluent and clear. Soft palate rises symmetrically and there is no tongue deviation. Hearing is intact to conversational tone. Tone: Tone is good throughout. Sensation: Sensation is intact to light touch and pinprick throughout (facial, trunk, extremities). Vibration is intact at the bilateral big toe. There is no extinction with double simultaneous stimulation. There is no sensory dermatomal level identified. Coordination:  The patient has no difficulty with RAM's or FNF bilaterally. Motor: Strength is 5/5 in the bilateral upper and lower extremities.  Shoulder shrug is equal and symmetric. There is no pronator drift.  There are no fasciculations noted. DTR's: Deep tendon reflexes are 2/4 at the bilateral biceps, triceps,  brachioradialis, patella and achilles.  Plantar responses are downgoing bilaterally. Gait and Station: The patient is able to ambulate without difficulty. The patient has some trouble walking on toes but able to walk on heels. The patient has some difficulty ambulating in a tandem fashion. The patient is able to stand in the Romberg position.  IMPRESSION/PLAN  1. 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  She will have the lab drawn today.  Ice test negative.  -Talked about treatments including botox if above negative.  Doesn't wish to proceed right now.  Did tell her that while botox could worsen facial asymmetry, untreated facial spasm could worsen asymmetry as well.    -f/u after if above negative and decides wants tx

## 2014-03-18 LAB — MYASTHENIA GRAVIS PANEL 2
Acetylcholine Rec Mod Ab: 17 %
Aceytlcholine Rec Bloc Ab: <15 % of inhibition (ref <15)

## 2014-03-19 ENCOUNTER — Telehealth: Payer: Self-pay | Admitting: Neurology

## 2014-03-19 NOTE — Telephone Encounter (Signed)
Message copied by Annamaria Helling on Thu Mar 19, 2014  8:00 AM ------      Message from: Narda Amber K      Created: Thu Mar 19, 2014  7:52 AM       Please notify patient that labs for myasthenia antibodies are negative. ------

## 2014-03-19 NOTE — Telephone Encounter (Signed)
Left message on machine for patient to call back.

## 2014-03-19 NOTE — Telephone Encounter (Signed)
Patient aware labs normal 

## 2014-03-23 ENCOUNTER — Telehealth: Payer: Self-pay | Admitting: Neurology

## 2014-03-23 ENCOUNTER — Other Ambulatory Visit: Payer: Self-pay | Admitting: *Deleted

## 2014-03-23 DIAGNOSIS — G5139 Clonic hemifacial spasm, unspecified: Secondary | ICD-10-CM

## 2014-03-23 DIAGNOSIS — H02402 Unspecified ptosis of left eyelid: Secondary | ICD-10-CM

## 2014-03-23 NOTE — Telephone Encounter (Signed)
Patient agreed with plan.  I called and cancelled MRI for now.

## 2014-03-23 NOTE — Telephone Encounter (Signed)
Any suggestions>?

## 2014-03-23 NOTE — Telephone Encounter (Signed)
Pt out of pocket is $1000.00 for MRI. Pt can not afford this. What other options are available? CB# [phone] / Sherri S.

## 2014-03-23 NOTE — Telephone Encounter (Signed)
Please inform patient that Dr. Carles Collet is out of the office and will return late next week to address her concerns.  CT brain will be cheaper with the quality of the results will not be as detailed as MRI would offer.    Atalaya Zappia K. Posey Pronto, DO

## 2014-03-27 ENCOUNTER — Ambulatory Visit (HOSPITAL_COMMUNITY): Payer: BC Managed Care – PPO

## 2014-03-30 NOTE — Addendum Note (Signed)
Addended byAnnamaria Helling on: 03/30/2014 11:34 AM   Modules accepted: Orders

## 2014-03-30 NOTE — Telephone Encounter (Signed)
Spoke with patient - made her aware we could order CT but this would not be as detailed and may miss something. She would like to proceed.

## 2014-03-30 NOTE — Telephone Encounter (Signed)
CT scheduled 04/13/2014 at 10:15 am. Patient made aware.

## 2014-03-30 NOTE — Telephone Encounter (Signed)
Agree with Dr. Posey Pronto.  CT doesn't offer resolution/detail for what we want/need but if she cannot afford the MRI, it is better than nothing but certainly not nearly as good as MRI and can miss things.

## 2014-03-31 ENCOUNTER — Telehealth: Payer: Self-pay | Admitting: Neurology

## 2014-03-31 NOTE — Telephone Encounter (Signed)
Any other options besides follow up?

## 2014-03-31 NOTE — Telephone Encounter (Signed)
As far as imaging, there are no other options

## 2014-03-31 NOTE — Telephone Encounter (Signed)
Patient aware no other options for imaging. She wants to know if we can proceed with Botox?

## 2014-03-31 NOTE — Telephone Encounter (Signed)
Pt called stating that she can not afford a MRI nor CT-Scan and she would like to know what her Other options are. C/B [phone]

## 2014-03-31 NOTE — Telephone Encounter (Signed)
Could try, but she told me she wasn't ready for that?  Did she change her mind?  If so, would need authorization

## 2014-04-01 NOTE — Telephone Encounter (Signed)
Left message on machine for patient to call back.

## 2014-04-06 NOTE — Telephone Encounter (Signed)
Patient would like to proceed with Botox. Will work on approval and call her back to schedule appt once approved. Dr Carles Collet- how much Botox should I get approval for?

## 2014-04-06 NOTE — Telephone Encounter (Signed)
1 vial is fine

## 2014-04-13 ENCOUNTER — Ambulatory Visit (HOSPITAL_COMMUNITY): Payer: BC Managed Care – PPO

## 2014-04-14 ENCOUNTER — Ambulatory Visit (HOSPITAL_COMMUNITY): Payer: BC Managed Care – PPO

## 2014-05-19 ENCOUNTER — Telehealth: Payer: Self-pay | Admitting: Neurology

## 2014-05-19 NOTE — Telephone Encounter (Signed)
Patient made aware Botox was denied by insurance. She would like to know how much this would cost out of pocket. Dr Carles Collet - how much Botox would we use on patient?

## 2014-05-19 NOTE — Telephone Encounter (Signed)
I think cost is a question for [name].  Probably would need under 50 units but unless I can split it with another patient, she would get charged for entire vial and the injection code.  My suggestion would be for her to use the appeal process with her insurance if she is able

## 2014-05-22 NOTE — Telephone Encounter (Signed)
As previous, I would suggest that she appeal her insurance companies decision.

## 2014-05-22 NOTE — Telephone Encounter (Signed)
Emailed Manuela Schwartz and still waiting for an answer. Patient called back and wanted to know if you would be willing to share information with her plastic surgeon about injection and she could do it there if too expensive here. I advised that this is different training needed than cosmetic botox and she should talk with her plastic surgeon to see if he is trained to do this. She states she would speak with their office - Dr Stephanie Coup.

## 2014-05-29 ENCOUNTER — Telehealth: Payer: Self-pay | Admitting: Neurology

## 2014-05-29 NOTE — Telephone Encounter (Signed)
Pt called requesting to speak to a nurse. She stated it was regarding insurance. C/B [phone]

## 2014-06-01 NOTE — Telephone Encounter (Signed)
LMOM for patient to call back - made her aware I finally have an answer on the cost of Botox.

## 2014-06-01 NOTE — Telephone Encounter (Signed)
Please return pt call 

## 2014-06-01 NOTE — Telephone Encounter (Signed)
Pt called f/u on the message from 05/29/14. Please call her after [place] C/B [phone]

## 2014-06-01 NOTE — Telephone Encounter (Signed)
Patient made aware of below message from financial department:  "[name],  The injection charge would be between $360 and $450. Looking at the office notes I think this would be unilateral but I'm giving a range in case Dr. Carles Collet does a bilateral injection. The drug cost would be $1180.00.   [name] "  And advised that an appeal would be the best route to go. She made an appt for follow up this week due to an increase in frequency of the hemifacial spasm and will start the appeal process with her insurance.

## 2014-06-03 ENCOUNTER — Ambulatory Visit (INDEPENDENT_AMBULATORY_CARE_PROVIDER_SITE_OTHER): Payer: BC Managed Care – PPO | Admitting: Neurology

## 2014-06-03 ENCOUNTER — Encounter: Payer: Self-pay | Admitting: Neurology

## 2014-06-03 VITALS — BP 132/68 | HR 106 | Ht 63.0 in | Wt 188.0 lb

## 2014-06-03 DIAGNOSIS — G518 Other disorders of facial nerve: Secondary | ICD-10-CM

## 2014-06-03 DIAGNOSIS — G5139 Clonic hemifacial spasm, unspecified: Secondary | ICD-10-CM | POA: Insufficient documentation

## 2014-06-03 NOTE — Progress Notes (Signed)
[name] was seen today in neurologic consultation at the request of Mathews Argyle, MD.  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 year old female with a history of L eye twitching x the last few months; her daughter mentioned it to her about 4-6 weeks ago.  Once other people mentioned it to her, she could then feel it pulling even though she was a little aware prior to that.  It doesn't hurt.  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  There is never a time where she cannot get the eye open but she states that there is a time when the eyes are asymmetric and the L is droopy.  She has no trouble climbing stairs except she has had bilateral knee replacement causing pain with climbing stairs.  No diplopia.  No difficulty swallowing or voice changes.  She is unsure if the cheek twitches but thinks that it might.     06/03/14 update:  The patient is following up today.  She has a history of left hemifacial spasm.  I recommended Botox for this, as there really are no other alternatives that work, but unfortunately her insurance denied the authorization.  I also recommended an MRI of the brain, but the patient refused that as she was unable to afford it.  Today, the patient presents to the office stating that symptoms of hemifacial spasm have gotten much worse.  She states that they eye will draw closed when driving and will cause her to "lose vision from the eye" until the spasm is gone.  She doesn't notices it when relaxed.     Neuroimaging has not previously been performed.   PREVIOUS MEDICATIONS: n/a  ALLERGIES:   Allergies  Allergen Reactions  . Lorcet [Hydrocodone-Acetaminophen]   . Penicillins   . Sulfa Antibiotics     CURRENT MEDICATIONS:  Current Outpatient Prescriptions on File Prior to Visit  Medication Sig Dispense Refill  . Ascorbic  Acid (VITAMIN C PO) Take by mouth.      Marland Kitchen BIOTIN PO Take by mouth.      . Calcium Carbonate-Vitamin D (CALCIUM + D PO) Take by mouth.      . cetirizine (ZYRTEC) 10 MG tablet Take 10 mg by mouth daily.      . Cyanocobalamin (VITAMIN B-12 PO) Take 5,000 Units by mouth daily.      . felodipine (PLENDIL) 5 MG 24 hr tablet Take 5 mg by mouth daily.      . Ferrous Sulfate (IRON) 28 MG TABS Take by mouth.      . fluticasone (FLONASE) 50 MCG/ACT nasal spray Place into both nostrils daily.      Marland Kitchen gabapentin (NEURONTIN) 600 MG tablet Take 600 mg by mouth 2 (two) times daily.       Marland Kitchen lidocaine (LIDODERM) 5 % Place 1 patch onto the skin daily. Remove & Discard patch within 12 hours or as directed by MD      . lisinopril (PRINIVIL,ZESTRIL) 10 MG tablet Take 10 mg by mouth daily.      Marland Kitchen LORazepam (ATIVAN) 0.5 MG tablet Take 0.5 mg by mouth every 8 (eight) hours.      . meloxicam (MOBIC) 7.5 MG tablet Take 7.5 mg by mouth daily.      . Multiple Vitamin (MULTIVITAMIN) tablet Take 1 tablet by mouth daily.      Marland Kitchen  POTASSIUM PO Take by mouth.      . traMADol (ULTRAM) 50 MG tablet Take by mouth every 6 (six) hours as needed.      . zolpidem (AMBIEN) 5 MG tablet Take 5 mg by mouth at bedtime as needed for sleep.       No current facility-administered medications on file prior to visit.    PAST MEDICAL HISTORY:   Past Medical History  Diagnosis Date  . Hypertension   . B12 deficiency   . Chronic pain   . Osteoarthritis   . History of shingles     PAST SURGICAL HISTORY:   Past Surgical History  Procedure Laterality Date  . Replacement total knee Bilateral   . Gastric bypass    . Abdominal hysterectomy    . Ankle surgery Right   . Facial cosmetic surgery    . Botox injection      facial    SOCIAL HISTORY:   History   Social History  . Marital Status: Widowed    Spouse Name: N/A    Number of Children: N/A  . Years of Education: N/A   Occupational History  .      HS principle   Social  History Main Topics  . Smoking status: Never Smoker   . Smokeless tobacco: Not on file  . Alcohol Use: Yes     Comment: 1-2 times per month  . Drug Use: No  . Sexual Activity: Not on file   Other Topics Concern  . Not on file   Social History Narrative  . No narrative on file    FAMILY HISTORY:   Family Status  Relation Status Death Age  . Brother Deceased     leukemia  . Father Deceased     heart disease, stroke  . Mother Alive     mobility issues  . Sister Alive     healthy, OA  . Brother Alive     gout  . Son Alive     healthy  . Daughter Alive     addiction issues    ROS:  A complete 10 system review of systems was obtained and was unremarkable apart from what is mentioned above.  PHYSICAL EXAMINATION:    VITALS:   Filed Vitals:   06/03/14 1414  BP: 132/68  Pulse: 106  Height: 5\' 3"  (1.6 m)  Weight: 188 lb (85.276 kg)    GEN:  Normal appears female in no acute distress.  Appears stated age. HEENT:  Normocephalic, atraumatic. The mucous membranes are moist. The superficial temporal arteries are without ropiness or tenderness. Cardiovascular: Regular rate and rhythm. Lungs: Clear to auscultation bilaterally. Neck/Heme: There are no carotid bruits noted bilaterally.  NEUROLOGICAL: Orientation:  The patient is alert and oriented x 3.  Fund of knowledge is appropriate.  Recent and remote memory intact.  Attention span and concentration normal.  Repeats and names without difficulty. Cranial nerves: There is decreased NL fold on the L that is just minor.  Minor L ptosis that fluctuates but does not change with ice test.  There is  L hemifacial spasm primarily involving L orbicularis oculi.  Forehead wrinkle symmetric (but has had botox previously).   The pupils are equal round and reactive to light bilaterally. Fundoscopic exam reveals clear disc margins bilaterally. Extraocular muscles are intact and visual fields are full to confrontational testing. Speech is  fluent and clear. Soft palate rises symmetrically and there is no tongue deviation. Hearing is intact to conversational  tone. Tone: Tone is good throughout. Coordination:  The patient has no difficulty with RAM's or FNF bilaterally. Motor: Strength is 5/5 in the bilateral upper and lower extremities.  Shoulder shrug is equal and symmetric. There is no pronator drift.  There are no fasciculations noted. DTR's: Deep tendon reflexes are 2/4 at the bilateral biceps, triceps, brachioradialis, patella and achilles.  Plantar responses are downgoing bilaterally. Gait and Station: The patient is able to ambulate without difficulty.   LABS:    AchR Ab neg  IMPRESSION/PLAN  1. 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  Understands risks of that  -Talked about unlikely possibility that this is MG and offered single fiber EMG but doesn't wish to proceed.  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  Will try and appeal to insurance.

## 2014-06-04 ENCOUNTER — Telehealth: Payer: Self-pay | Admitting: Neurology

## 2014-06-04 NOTE — Telephone Encounter (Signed)
Last two office notes faxed to [facility] at [phone] with confirmation received along with patient's letter and denial letter for Botox. Reference number [id number].

## 2014-06-16 ENCOUNTER — Telehealth: Payer: Self-pay | Admitting: Neurology

## 2014-06-16 NOTE — Telephone Encounter (Signed)
Left message on machine for patient to call back.

## 2014-06-16 NOTE — Telephone Encounter (Signed)
Pt called requesting to speak to a nurse. She has some questions she needs to address.  C/B [phone]

## 2014-06-16 NOTE — Telephone Encounter (Signed)
Patient wanted to call with an update to let us know that she is having worsening trouble with her eye - it is jumping more and she is having difficulty reading now. She has seen her eye doctor and he gave her the name of a surgeon/opthalmologist. She just wanted to keep [place] updated - let her know we have not received an answer on her appeal for Botox.

## 2014-06-18 ENCOUNTER — Telehealth: Payer: Self-pay | Admitting: Neurology

## 2014-06-18 ENCOUNTER — Other Ambulatory Visit: Payer: Self-pay | Admitting: Neurology

## 2014-06-18 DIAGNOSIS — G5139 Clonic hemifacial spasm, unspecified: Secondary | ICD-10-CM

## 2014-06-18 NOTE — Telephone Encounter (Signed)
Botox appt made for patient. Approval received.

## 2014-06-19 ENCOUNTER — Encounter: Payer: Self-pay | Admitting: Neurology

## 2014-06-19 ENCOUNTER — Ambulatory Visit (INDEPENDENT_AMBULATORY_CARE_PROVIDER_SITE_OTHER): Payer: BC Managed Care – PPO | Admitting: Neurology

## 2014-06-19 VITALS — BP 130/80 | HR 79 | Ht 63.0 in | Wt 191.4 lb

## 2014-06-19 DIAGNOSIS — G518 Other disorders of facial nerve: Secondary | ICD-10-CM

## 2014-06-19 DIAGNOSIS — G5139 Clonic hemifacial spasm, unspecified: Secondary | ICD-10-CM

## 2014-06-19 MED ORDER — ONABOTULINUMTOXINA 100 UNITS IJ SOLR
20.0000 [IU] | Freq: Once | INTRAMUSCULAR | Status: AC
  Administered 2014-06-19: 20 [IU] via INTRAMUSCULAR

## 2014-06-19 NOTE — Procedures (Signed)
[facility]   History:  Diagnosis: Hemifacial spasm (351.8)  Initial side: left   Result History  Onset of effect: n/a  Duration of Benefit: n/a  Adverse Effects: n/a (pt does have baseline facial asymmetry with baseline ptosis on the L)   Consent obtained from: The patient Benefits discussed included, but were not limited to decreased muscle tightness, increased joint range of motion, and decreased pain.  Risk discussed included, but were not limited pain and discomfort, bleeding, increased ptosis, increased facial asymmetry, bruising, excessive weakness, venous thrombosis, muscle atrophy and dysphagia.  A copy of the patient medication guide was given to the patient which explains the blackbox warning.  Patients identity and treatment sites confirmed Yes.  .  Details of Procedure: Skin was cleaned with alcohol.   Prior to injection, the needle plunger was aspirated to make sure the needle was not within a blood vessel.  There was no blood retrieved on aspiration.    Following is a summary of the muscles injected  And the amount of Botulinum toxin used:  Injections  Location Left  Right Units Number of sites        Corrugator      Frontalis      Lower Lid, Lateral 2.5   1  Lower Lid Medial 2.5   1  Upper Lid, Lateral 2.5   1  Upper Lid, Medial 2.5   1  Canthus 5.0   1  Temporalis      Masseter      Procerus      Zygomaticus Major      TOTAL UNITS:   15.0    Agent: Botulinum Type A ( Onobotulinum Toxin type A ).  1 vials of Botox were used, each containing 50 units and freshly diluted with 2 mL of sterile, non-perserved saline   Total injected (Units): 15  Total wasted (Units): 5 Pt tolerated procedure well without complications.   Reinjection is anticipated in 3 months.

## 2014-09-09 ENCOUNTER — Other Ambulatory Visit: Payer: Self-pay | Admitting: Neurology

## 2014-09-09 DIAGNOSIS — G5139 Clonic hemifacial spasm, unspecified: Secondary | ICD-10-CM

## 2014-09-15 ENCOUNTER — Telehealth: Payer: Self-pay | Admitting: Neurology

## 2014-09-15 NOTE — Telephone Encounter (Signed)
Left message on machine for patient to call back.

## 2014-09-15 NOTE — Telephone Encounter (Signed)
[phone] please call patient

## 2014-09-16 NOTE — Telephone Encounter (Signed)
i'm okay if she waits.

## 2014-09-16 NOTE — Telephone Encounter (Signed)
Patient made aware. She will contact us as soon as any symptoms start.

## 2014-09-16 NOTE — Telephone Encounter (Signed)
Spoke with patient. She is scheduled for Botox injection on Friday and she states she has had no symptoms come back at all. She wants to know if she should keep appt and have injections before any symptoms may start back or if she should wait and see if symptoms start developing again. Please advise.

## 2014-09-16 NOTE — Telephone Encounter (Signed)
Pt is returning your call please call her back [phone]

## 2014-09-18 ENCOUNTER — Ambulatory Visit: Payer: BC Managed Care – PPO | Admitting: Neurology

## 2014-10-21 ENCOUNTER — Other Ambulatory Visit: Payer: Self-pay | Admitting: Neurology

## 2014-10-21 DIAGNOSIS — G5139 Clonic hemifacial spasm, unspecified: Secondary | ICD-10-CM

## 2014-10-30 ENCOUNTER — Ambulatory Visit (INDEPENDENT_AMBULATORY_CARE_PROVIDER_SITE_OTHER): Payer: BC Managed Care – PPO | Admitting: Neurology

## 2014-10-30 ENCOUNTER — Encounter: Payer: Self-pay | Admitting: Neurology

## 2014-10-30 DIAGNOSIS — G5139 Clonic hemifacial spasm, unspecified: Secondary | ICD-10-CM

## 2014-10-30 DIAGNOSIS — G518 Other disorders of facial nerve: Secondary | ICD-10-CM

## 2014-10-30 MED ORDER — ONABOTULINUMTOXINA 100 UNITS IJ SOLR
20.0000 [IU] | Freq: Once | INTRAMUSCULAR | Status: AC
  Administered 2014-10-30: 20 [IU] via INTRAMUSCULAR

## 2014-10-30 NOTE — Procedures (Signed)
[facility]   History:  Diagnosis: Hemifacial spasm (351.8)  Initial side: left   Result History  Onset of effect: 1 week Duration of Benefit: wore off after 3 months and 1 week Adverse Effects: n/a (pt does have baseline facial asymmetry with baseline ptosis on the L)   Consent obtained from: The patient Benefits discussed included, but were not limited to decreased muscle tightness, increased joint range of motion, and decreased pain.  Risk discussed included, but were not limited pain and discomfort, bleeding, increased ptosis, increased facial asymmetry, bruising, excessive weakness, venous thrombosis, muscle atrophy and dysphagia.  A copy of the patient medication guide was given to the patient which explains the blackbox warning.  Patients identity and treatment sites confirmed Yes.  .  Details of Procedure: Skin was cleaned with alcohol.   Prior to injection, the needle plunger was aspirated to make sure the needle was not within a blood vessel.  There was no blood retrieved on aspiration.    Following is a summary of the muscles injected  And the amount of Botulinum toxin used:  Injections  Location Left  Right Units Number of sites        Corrugator      Frontalis      Lower Lid, Lateral 2.5   1  Lower Lid Medial 2.5   1  Upper Lid, Lateral 2.5   1  Upper Lid, Medial 2.5   1  Canthus 5.0   1  Temporalis      Masseter      Procerus      Zygomaticus Major      TOTAL UNITS:   15.0    Agent: Botulinum Type A ( Onobotulinum Toxin type A ).  1 vials of Botox were used, each containing 50 units and freshly diluted with 2 mL of sterile, non-perserved saline   Total injected (Units): 15  Total wasted (Units): 5 Pt tolerated procedure well without complications.   Reinjection is anticipated in 3 months.

## 2014-11-06 ENCOUNTER — Telehealth: Payer: Self-pay | Admitting: Neurology

## 2014-11-06 ENCOUNTER — Telehealth: Payer: Self-pay | Admitting: *Deleted

## 2014-11-06 NOTE — Telephone Encounter (Signed)
Patient is aware per Dr Carles Collet  [name] sorry that happened!  Could be side effect.  IF she can't close it, use artificial tears in it and tape closed at night.  Will slowly get better over the next few weeks to months.  Nothing I can do to speed that up

## 2014-11-06 NOTE — Telephone Encounter (Signed)
Please advise 

## 2014-11-06 NOTE — Telephone Encounter (Signed)
Pt called stating that is returning Dr. Doristine Devoid call.  C/b [phone]

## 2014-11-06 NOTE — Telephone Encounter (Signed)
Are you needing me to do something with this?  Not sure why it was routed back to me

## 2014-11-06 NOTE — Telephone Encounter (Signed)
left message for patient to return my call regarding     I'm sorry that happened!  Could be side effect.  IF she can't close it, use artificial tears in it and tape closed at night.  Will slowly get better over the next few weeks to months.  Nothing I can do to speed that up unfortunately

## 2014-11-06 NOTE — Telephone Encounter (Signed)
I'm sorry that happened!  Could be side effect.  IF she can't close it, use artificial tears in it and tape closed at night.  Will slowly get better over the next few weeks to months.  Nothing I can do to speed that up unfortunately

## 2014-11-06 NOTE — Telephone Encounter (Signed)
Pt needs to talk to someone about the botox injection she is not able to close her left eye all the way please call [phone]

## 2015-01-19 ENCOUNTER — Telehealth: Payer: Self-pay | Admitting: Neurology

## 2015-01-19 NOTE — Telephone Encounter (Signed)
Pt wants me to hold on to the appt on 01-29-15 until you call her and let her know when she could come in for the BOTOX  please call her at [phone]

## 2015-01-20 NOTE — Telephone Encounter (Signed)
See me

## 2015-01-20 NOTE — Telephone Encounter (Signed)
Spoke with patient and she states she had some trouble with her eye closing after her last injection. She states this just relaxed 2 weeks ago and she was nervous to have another injection now. Appt cancelled for Friday and r/s to June Botox day. She will call with any problems.

## 2015-01-20 NOTE — Telephone Encounter (Signed)
Can she be on a non Botox day? Please advise.

## 2015-01-29 ENCOUNTER — Ambulatory Visit: Payer: BC Managed Care – PPO | Admitting: Neurology

## 2015-03-12 ENCOUNTER — Ambulatory Visit (INDEPENDENT_AMBULATORY_CARE_PROVIDER_SITE_OTHER): Payer: BC Managed Care – PPO | Admitting: Neurology

## 2015-03-12 VITALS — Temp 98.1°F

## 2015-03-12 DIAGNOSIS — G5139 Clonic hemifacial spasm, unspecified: Secondary | ICD-10-CM

## 2015-03-12 DIAGNOSIS — G518 Other disorders of facial nerve: Secondary | ICD-10-CM | POA: Diagnosis not present

## 2015-03-12 MED ORDER — ONABOTULINUMTOXINA 100 UNITS IJ SOLR
35.0000 [IU] | Freq: Once | INTRAMUSCULAR | Status: AC
  Administered 2015-03-12: 35 [IU] via INTRAMUSCULAR

## 2015-03-12 NOTE — Procedures (Signed)
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  Risk discussed included, but were not limited pain and discomfort, bleeding, increased ptosis, increased facial asymmetry, bruising, excessive weakness, venous thrombosis, muscle atrophy and dysphagia.  A copy of the patient medication guide was given to the patient which explains the blackbox warning.  Patients identity and treatment sites confirmed Yes.  .  Details of Procedure: Skin was cleaned with alcohol.   Prior to injection, the needle plunger was aspirated to make sure the needle was not within a blood vessel.  There was no blood retrieved on aspiration.    Following is a summary of the muscles injected  And the amount of Botulinum toxin used:  Injections  Location Left  Right Units Number of sites        Corrugator      Frontalis      Lower Lid, Lateral 2.5   1  Lower Lid Medial 2.5   1  Upper Lid, Lateral 2.5   1  Upper Lid, Medial 2.5   1  Canthus 5.0   1  Temporalis      Masseter      Procerus      Zygomaticus Major      TOTAL UNITS:   15.0    Agent: Botulinum Type A ( Onobotulinum Toxin type A ).  1 vials of Botox were used, each containing 50 units and freshly diluted with 2 mL of sterile, non-perserved saline   Total injected (Units): 15  Total wasted (Units): 20 Pt tolerated procedure well without complications.    Reinjection is anticipated in 3 months.

## 2015-06-11 ENCOUNTER — Ambulatory Visit (INDEPENDENT_AMBULATORY_CARE_PROVIDER_SITE_OTHER): Payer: BC Managed Care – PPO | Admitting: Neurology

## 2015-06-11 ENCOUNTER — Encounter: Payer: Self-pay | Admitting: Neurology

## 2015-06-11 VITALS — Temp 98.2°F

## 2015-06-11 DIAGNOSIS — G5139 Clonic hemifacial spasm, unspecified: Secondary | ICD-10-CM

## 2015-06-11 DIAGNOSIS — G518 Other disorders of facial nerve: Secondary | ICD-10-CM | POA: Diagnosis not present

## 2015-06-11 MED ORDER — ONABOTULINUMTOXINA 100 UNITS IJ SOLR
15.0000 [IU] | Freq: Once | INTRAMUSCULAR | Status: AC
  Administered 2015-06-11: 15 [IU] via INTRAMUSCULAR

## 2015-06-11 NOTE — Procedures (Signed)
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  Risk discussed included, but were not limited pain and discomfort, bleeding, increased ptosis, increased facial asymmetry, bruising, excessive weakness, venous thrombosis, muscle atrophy and dysphagia.  A copy of the patient medication guide was given to the patient which explains the blackbox warning.  Patients identity and treatment sites confirmed Yes.  .  Details of Procedure: Skin was cleaned with alcohol.   Prior to injection, the needle plunger was aspirated to make sure the needle was not within a blood vessel.  There was no blood retrieved on aspiration.    Following is a summary of the muscles injected  And the amount of Botulinum toxin used:  Injections  Location Left  Right Units Number of sites        Corrugator      Frontalis      Lower Lid, Lateral 2.5   1  Lower Lid Medial 2.5   1  Upper Lid, Lateral 2.5   1  Upper Lid, Medial 2.5   1  Canthus 5.0   1  Temporalis      Masseter      Procerus      Zygomaticus Major      TOTAL UNITS:   15.0    Agent: Botulinum Type A ( Onobotulinum Toxin type A ).  1 vials of Botox were used, each containing 50 units and freshly diluted with 2 mL of sterile, non-perserved saline   Total injected (Units): 15  Total wasted (Units): 20 Pt tolerated procedure well; small area of ecchymosis noted under eye after completed.   Reinjection is anticipated in 3 months.

## 2015-07-08 ENCOUNTER — Ambulatory Visit: Payer: BC Managed Care – PPO | Admitting: Podiatry

## 2015-08-16 ENCOUNTER — Telehealth: Payer: Self-pay | Admitting: Neurology

## 2015-08-16 MED ORDER — BACLOFEN 10 MG PO TABS: 10.0000 mg | ORAL_TABLET | Freq: Two times a day (BID) | ORAL | Status: DC

## 2015-08-16 NOTE — Telephone Encounter (Signed)
Patient made aware. RX sent to pharmacy.  

## 2015-08-16 NOTE — Telephone Encounter (Signed)
She could try low dose baclofen, but not sure if it will help.  This medication can cause significant drowsiness so will need to be careful, esp if driving.  Can just start 10 mg at night and if tolerated after a week try bid.

## 2015-08-16 NOTE — Telephone Encounter (Signed)
pt states she is having really bad problems and wants to come in sooner for botox shot please call patient [phone]

## 2015-08-16 NOTE — Telephone Encounter (Signed)
Patient states for the past week her facial spasm has come back - but not just at her eye it is the entire side of her face. Made her aware that we could not repeat Botox for 3 months (which would be after December 10). Made her aware I would let Dr Tat know about this and call back with any advice.

## 2015-09-10 ENCOUNTER — Encounter: Payer: Self-pay | Admitting: Pulmonary Disease

## 2015-09-24 ENCOUNTER — Ambulatory Visit (INDEPENDENT_AMBULATORY_CARE_PROVIDER_SITE_OTHER): Payer: BC Managed Care – PPO | Admitting: Neurology

## 2015-09-24 DIAGNOSIS — G518 Other disorders of facial nerve: Secondary | ICD-10-CM

## 2015-09-24 DIAGNOSIS — G5139 Clonic hemifacial spasm, unspecified: Secondary | ICD-10-CM

## 2015-09-24 MED ORDER — ONABOTULINUMTOXINA 100 UNITS IJ SOLR
30.0000 [IU] | Freq: Once | INTRAMUSCULAR | Status: AC
  Administered 2015-09-24: 30 [IU] via INTRAMUSCULAR

## 2015-09-24 NOTE — Procedures (Signed)
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 Relaxer and took a few and then twitching went away and it is much better now Adverse Effects: n/a  Consent obtained from: The patient Benefits discussed included, but were not limited to decreased muscle tightness, increased joint range of motion, and decreased pain.  Risk discussed included, but were not limited pain and discomfort, bleeding, increased ptosis, increased facial asymmetry, bruising, excessive weakness, venous thrombosis, muscle atrophy and dysphagia.  A copy of the patient medication guide was given to the patient which explains the blackbox warning.  Patients identity and treatment sites confirmed Yes.  .  Details of Procedure: Skin was cleaned with alcohol.   Prior to injection, the needle plunger was aspirated to make sure the needle was not within a blood vessel.  There was no blood retrieved on aspiration.    Following is a summary of the muscles injected  And the amount of Botulinum toxin used:  Injections  Location Left  Right Units Number of sites        Corrugator      Frontalis      Lower Lid, Lateral 2.5   1  Lower Lid Medial 2.5   1  Upper Lid, Lateral 2.5   1  Upper Lid, Medial 2.5   1  Canthus 5.0   1  Temporalis      Masseter      Procerus      Zygomaticus Major      TOTAL UNITS:   15.0    Agent: Botulinum Type A ( Onobotulinum Toxin type A ).  1 vials of Botox were used, each containing 50 units and freshly diluted with 2 mL of sterile, non-perserved saline   Total injected (Units): 15  Total wasted (Units):  10 Pt tolerated procedure well; no complications Reinjection is anticipated in 3 months.

## 2015-10-03 HISTORY — PX: ORIF WRIST FRACTURE: SHX2133

## 2015-12-24 ENCOUNTER — Encounter: Payer: Self-pay | Admitting: Neurology

## 2015-12-24 ENCOUNTER — Ambulatory Visit (INDEPENDENT_AMBULATORY_CARE_PROVIDER_SITE_OTHER): Payer: BC Managed Care – PPO | Admitting: Neurology

## 2015-12-24 VITALS — Temp 97.4°F

## 2015-12-24 DIAGNOSIS — G5139 Clonic hemifacial spasm, unspecified: Secondary | ICD-10-CM

## 2015-12-24 DIAGNOSIS — G518 Other disorders of facial nerve: Secondary | ICD-10-CM | POA: Diagnosis not present

## 2015-12-24 MED ORDER — ONABOTULINUMTOXINA 100 UNITS IJ SOLR
15.0000 [IU] | Freq: Once | INTRAMUSCULAR | Status: AC
  Administered 2015-12-24: 15 [IU] via INTRAMUSCULAR

## 2015-12-24 NOTE — Procedures (Signed)
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  Risk discussed included, but were not limited pain and discomfort, bleeding, increased ptosis, increased facial asymmetry, bruising, excessive weakness, venous thrombosis, muscle atrophy and dysphagia.  A copy of the patient medication guide was given to the patient which explains the blackbox warning.  Patients identity and treatment sites confirmed Yes.  .  Details of Procedure: Skin was cleaned with alcohol.   Prior to injection, the needle plunger was aspirated to make sure the needle was not within a blood vessel.  There was no blood retrieved on aspiration.    Following is a summary of the muscles injected  And the amount of Botulinum toxin used:  Injections  Location Left  Right Units Number of sites        Corrugator      Frontalis      Lower Lid, Lateral 2.5   1  Lower Lid Medial 2.5   1  Upper Lid, Lateral 2.5   1  Upper Lid, Medial 2.5   1  Canthus 5.0   1  Temporalis      Masseter      Procerus      Zygomaticus Major      TOTAL UNITS:   15.0    Agent: Botulinum Type A ( Onobotulinum Toxin type A ).  1 vials of Botox were used, each containing 50 units and freshly diluted with 2 mL of sterile, non-perserved saline   Total injected (Units): 15  Total wasted (Units): 25 Pt tolerated procedure well; no complications Reinjection is anticipated in 3 months.

## 2016-01-04 ENCOUNTER — Telehealth: Payer: Self-pay | Admitting: Neurology

## 2016-01-04 NOTE — Telephone Encounter (Signed)
I think that she asked me when I injected her if that could happen because she had started to notice it then.  I remember seeing just a single twitch on the right when I injected her.  I told her it could happen on the right.  Just need to monitor it and we can inject a little if it increases

## 2016-01-04 NOTE — Telephone Encounter (Signed)
Patient called back and left voicemail. She states since she had left eye injected she does notice that she has twitching on the right now. She just wanted to make [place] aware in case this needs to be addressed at her next appt or if she should be doing anything until then. Please advise.

## 2016-01-04 NOTE — Telephone Encounter (Signed)
PT called in regards to having problems with right eye/Dawn CB# [phone]

## 2016-01-04 NOTE — Telephone Encounter (Signed)
Left message on machine for patient to call back.

## 2016-01-05 NOTE — Telephone Encounter (Signed)
Patient made aware.

## 2016-02-03 ENCOUNTER — Other Ambulatory Visit: Payer: Self-pay

## 2016-02-03 DIAGNOSIS — Z1231 Encounter for screening mammogram for malignant neoplasm of breast: Secondary | ICD-10-CM

## 2016-03-24 ENCOUNTER — Ambulatory Visit (INDEPENDENT_AMBULATORY_CARE_PROVIDER_SITE_OTHER): Payer: BC Managed Care – PPO | Admitting: Neurology

## 2016-03-24 DIAGNOSIS — G245 Blepharospasm: Secondary | ICD-10-CM | POA: Diagnosis not present

## 2016-03-24 DIAGNOSIS — G518 Other disorders of facial nerve: Secondary | ICD-10-CM | POA: Diagnosis not present

## 2016-03-24 DIAGNOSIS — G5139 Clonic hemifacial spasm, unspecified: Secondary | ICD-10-CM

## 2016-03-24 MED ORDER — ONABOTULINUMTOXINA 100 UNITS IJ SOLR
22.5000 [IU] | Freq: Once | INTRAMUSCULAR | Status: AC
  Administered 2016-03-27: 22.5 [IU] via INTRAMUSCULAR

## 2016-03-24 NOTE — Procedures (Addendum)
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  Risk discussed included, but were not limited pain and discomfort, bleeding, increased ptosis, increased facial asymmetry, bruising, excessive weakness, venous thrombosis, muscle atrophy and dysphagia.  A copy of the patient medication guide was given to the patient which explains the blackbox warning.  Patients identity and treatment sites confirmed Yes.  .  Details of Procedure: Skin was cleaned with alcohol.   Prior to injection, the needle plunger was aspirated to make sure the needle was not within a blood vessel.  There was no blood retrieved on aspiration.    Following is a summary of the muscles injected  And the amount of Botulinum toxin used:  Injections  Location Left  Right Units Number of sites        Corrugator      Frontalis      Lower Lid, Lateral 2.5 2.5 5.0 1 each side  Lower Lid Medial 2.5  2.5 1  Upper Lid, Lateral 2.5 2.5 5.0 1 each side  Upper Lid, Medial 2.5  2.5 1  Canthus 5.0 2.5 7.5 1 each side  Temporalis      Masseter      Procerus      Zygomaticus Major      TOTAL UNITS:   22.5    Agent: Botulinum Type A ( Onobotulinum Toxin type A ).  1 vials of Botox were used, each containing 50 units and freshly diluted with 2 mL of sterile, non-perserved saline   Total injected (Units): 22.5  Total wasted (Units): 0 Pt tolerated procedure well; no complications Reinjection is anticipated in 3 months.

## 2016-03-27 DIAGNOSIS — G518 Other disorders of facial nerve: Secondary | ICD-10-CM | POA: Diagnosis not present

## 2016-04-17 ENCOUNTER — Other Ambulatory Visit: Payer: Self-pay | Admitting: Neurology

## 2016-04-17 NOTE — Telephone Encounter (Signed)
Baclofen refill requested. Per last office note- patient to remain on medication. Refill approved and sent to patient's pharmacy.   

## 2016-05-16 ENCOUNTER — Ambulatory Visit
Admission: RE | Admit: 2016-05-16 | Discharge: 2016-05-16 | Disposition: A | Discharge status: Home / Self Care | Payer: Medicare Other | Source: Ambulatory Visit

## 2016-05-16 DIAGNOSIS — Z1231 Encounter for screening mammogram for malignant neoplasm of breast: Secondary | ICD-10-CM

## 2016-06-23 ENCOUNTER — Ambulatory Visit (INDEPENDENT_AMBULATORY_CARE_PROVIDER_SITE_OTHER): Payer: Medicare Other | Admitting: Neurology

## 2016-06-23 DIAGNOSIS — G518 Other disorders of facial nerve: Secondary | ICD-10-CM

## 2016-06-23 DIAGNOSIS — G5139 Clonic hemifacial spasm, unspecified: Secondary | ICD-10-CM

## 2016-06-23 DIAGNOSIS — G245 Blepharospasm: Secondary | ICD-10-CM

## 2016-06-23 MED ORDER — ONABOTULINUMTOXINA 100 UNITS IJ SOLR
22.5000 [IU] | Freq: Once | INTRAMUSCULAR | Status: AC
  Administered 2016-06-23: 22.5 [IU] via INTRAMUSCULAR

## 2016-06-23 NOTE — Procedures (Signed)
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  Risk discussed included, but were not limited pain and discomfort, bleeding, increased ptosis, increased facial asymmetry, bruising, excessive weakness, venous thrombosis, muscle atrophy and dysphagia.  A copy of the patient medication guide was given to the patient which explains the blackbox warning.  Patients identity and treatment sites confirmed Yes.  .  Details of Procedure: Skin was cleaned with alcohol.   Prior to injection, the needle plunger was aspirated to make sure the needle was not within a blood vessel.  There was no blood retrieved on aspiration.    Following is a summary of the muscles injected  And the amount of Botulinum toxin used:  Injections  Location Left  Right Units Number of sites        Corrugator      Frontalis      Lower Lid, Lateral 2.5 2.5 5.0 1 each side  Lower Lid Medial 2.5  2.5 1  Upper Lid, Lateral 2.5 2.5 5.0 1 each side  Upper Lid, Medial 2.5  2.5 1  Canthus 5.0 2.5 7.5 1 each side  Temporalis      Masseter      Procerus      Zygomaticus Major      TOTAL UNITS:   22.5    Agent: Botulinum Type A ( Onobotulinum Toxin type A ).  1 vials of Botox were used, each containing 50 units and freshly diluted with 2 mL of sterile, non-perserved saline   Total injected (Units): 22.5  Total wasted (Units): 15 Pt tolerated procedure well; no complications Reinjection is anticipated in 3 months.

## 2016-07-06 ENCOUNTER — Ambulatory Visit: Payer: BC Managed Care – PPO | Admitting: Neurology

## 2016-07-28 ENCOUNTER — Encounter (HOSPITAL_COMMUNITY): Payer: Self-pay

## 2016-07-28 ENCOUNTER — Emergency Department (HOSPITAL_COMMUNITY)
Admission: EM | Admit: 2016-07-28 | Discharge: 2016-07-28 | Disposition: A | Discharge status: Home / Self Care | Payer: Medicare Other | Attending: Emergency Medicine | Admitting: Emergency Medicine

## 2016-07-28 ENCOUNTER — Emergency Department (HOSPITAL_COMMUNITY): Payer: Medicare Other

## 2016-07-28 DIAGNOSIS — Z79899 Other long term (current) drug therapy: Secondary | ICD-10-CM | POA: Diagnosis not present

## 2016-07-28 DIAGNOSIS — W2201XA Walked into wall, initial encounter: Secondary | ICD-10-CM | POA: Diagnosis not present

## 2016-07-28 DIAGNOSIS — Y92000 Kitchen of unspecified non-institutional (private) residence as  the place of occurrence of the external cause: Secondary | ICD-10-CM | POA: Diagnosis not present

## 2016-07-28 DIAGNOSIS — S8001XA Contusion of right knee, initial encounter: Secondary | ICD-10-CM | POA: Insufficient documentation

## 2016-07-28 DIAGNOSIS — Y999 Unspecified external cause status: Secondary | ICD-10-CM | POA: Insufficient documentation

## 2016-07-28 DIAGNOSIS — Y939 Activity, unspecified: Secondary | ICD-10-CM | POA: Insufficient documentation

## 2016-07-28 DIAGNOSIS — I10 Essential (primary) hypertension: Secondary | ICD-10-CM | POA: Insufficient documentation

## 2016-07-28 DIAGNOSIS — S61412A Laceration without foreign body of left hand, initial encounter: Secondary | ICD-10-CM | POA: Diagnosis not present

## 2016-07-28 DIAGNOSIS — M25561 Pain in right knee: Secondary | ICD-10-CM

## 2016-07-28 DIAGNOSIS — Z96653 Presence of artificial knee joint, bilateral: Secondary | ICD-10-CM | POA: Insufficient documentation

## 2016-07-28 DIAGNOSIS — S61422A Laceration with foreign body of left hand, initial encounter: Secondary | ICD-10-CM

## 2016-07-28 DIAGNOSIS — W19XXXA Unspecified fall, initial encounter: Secondary | ICD-10-CM

## 2016-07-28 DIAGNOSIS — S61211A Laceration without foreign body of left index finger without damage to nail, initial encounter: Secondary | ICD-10-CM | POA: Diagnosis not present

## 2016-07-28 DIAGNOSIS — S0083XA Contusion of other part of head, initial encounter: Secondary | ICD-10-CM | POA: Diagnosis not present

## 2016-07-28 MED ORDER — TETANUS-DIPHTH-ACELL PERTUSSIS 5-2.5-18.5 LF-MCG/0.5 IM SUSP
0.5000 mL | Freq: Once | INTRAMUSCULAR | Status: AC
  Administered 2016-07-28: 0.5 mL via INTRAMUSCULAR
  Filled 2016-07-28: qty 0.5

## 2016-07-28 MED ORDER — LIDOCAINE HCL (PF) 1 % IJ SOLN
5.0000 mL | Freq: Once | INTRAMUSCULAR | Status: AC
  Administered 2016-07-28: 5 mL via INTRADERMAL
  Filled 2016-07-28: qty 5

## 2016-07-28 MED ORDER — LIDOCAINE-EPINEPHRINE (PF) 2 %-1:200000 IJ SOLN: 10.0000 mL | Freq: Once | INTRAMUSCULAR | Status: DC

## 2016-07-28 NOTE — ED Notes (Signed)
Cleaned the pt's hand lacerations with a 50-50 mix of Hydrogen Peroxide and Saline solution, per Laddie Aquas, RN.

## 2016-07-28 NOTE — ED Notes (Signed)
Pt tripped and fell today at home striking her head on the kitchen cabinet. Denies LOC. C/O pain to head, bilateral knees with bruising. Left hand with lacerations x 2 and pain left hand. C/o pain right posterior forearm with abrasion. Pt states she was unable to get up until her son came about 58 min.later. States was dizzy on standing and continues to feel dizzy.

## 2016-07-28 NOTE — ED Provider Notes (Signed)
[facility] DEPT Provider Note   By signing my name below, I, Bea Graff, attest that this documentation has been prepared under the direction and in the presence of Idolina Mantell, PA-C. Electronically Signed: Bea Graff, ED Scribe. 07/28/16. 2:58 PM.    History   Chief Complaint Chief Complaint  Patient presents with  . Hand Injury    The history is provided by the patient and medical records. No language interpreter was used.    HPI Comments:  [name] is a 65 y.o. female who presents to the Emergency Department complaining of tripping and falling PTA. She states she had a coffee cup in her hand when she tripped and fell into the wall, breaking the cup causing her to lacerate the left hand. She reports hitting her head and falling on her right knee. She has not taken anything for pain or done anything to treat her wounds. She denies modifying factors of the injury. She denies LOC, nausea, vomiting, current dizziness or dizziness prior to the fall, light-headedness, visual changes, neck pain, back pain, numbness, tingling or weakness of any extremity, HA, left wrist or elbow pain. She has been ambulatory since the fall. She denies being on anticoagulants. She is unsure of her last tetanus vaccination. Pt has had bilateral knee replacements.    Past Medical History:  Diagnosis Date  . B12 deficiency   . Chronic pain   . History of shingles   . Hypertension   . Osteoarthritis     Patient Active Problem List   Diagnosis Date Noted  . Hemifacial spasm 06/03/2014    Past Surgical History:  Procedure Laterality Date  . ABDOMINAL HYSTERECTOMY    . ANKLE SURGERY Right   . BOTOX INJECTION     facial  . FACIAL COSMETIC SURGERY    . GASTRIC BYPASS    . REPLACEMENT TOTAL KNEE Bilateral     OB History    No data available       Home Medications    Prior to Admission medications   Medication Sig Start Date End Date Taking? Authorizing Provider    Ascorbic Acid (VITAMIN C PO) Take by mouth.    Historical Provider, MD  baclofen (LIORESAL) 10 MG tablet Take 1 tablet (10 mg total) by mouth 2 (two) times daily. 04/17/16   Rebecca S Tat, DO  BIOTIN PO Take by mouth.    Historical Provider, MD  Calcium Carbonate-Vitamin D (CALCIUM + D PO) Take by mouth.    Historical Provider, MD  Cyanocobalamin (VITAMIN B-12 PO) Take 5,000 Units by mouth daily.    Historical Provider, MD  felodipine (PLENDIL) 5 MG 24 hr tablet Take 5 mg by mouth daily.    Historical Provider, MD  Ferrous Sulfate (IRON) 28 MG TABS Take by mouth.    Historical Provider, MD  fluticasone (FLONASE) 50 MCG/ACT nasal spray Place into both nostrils daily.    Historical Provider, MD  gabapentin (NEURONTIN) 600 MG tablet Take 600 mg by mouth 2 (two) times daily.     Historical Provider, MD  levocetirizine (XYZAL) 5 MG tablet Take 5 mg by mouth every evening. 05/31/16   Historical Provider, MD  lidocaine (LIDODERM) 5 % Place 1 patch onto the skin daily. Remove & Discard patch within 12 hours or as directed by MD    Historical Provider, MD  lisinopril (PRINIVIL,ZESTRIL) 10 MG tablet Take 10 mg by mouth daily.    Historical Provider, MD  LORazepam (ATIVAN) 0.5 MG tablet Take 0.5 mg  by mouth every 8 (eight) hours.    Historical Provider, MD  meloxicam (MOBIC) 7.5 MG tablet Take 7.5 mg by mouth daily.    Historical Provider, MD  montelukast (SINGULAIR) 10 MG tablet Take 10 mg by mouth every evening. 05/31/16   Historical Provider, MD  Multiple Vitamin (MULTIVITAMIN) tablet Take 1 tablet by mouth daily.    Historical Provider, MD  POTASSIUM PO Take by mouth.    Historical Provider, MD  traMADol (ULTRAM) 50 MG tablet Take by mouth every 6 (six) hours as needed.    Historical Provider, MD  zolpidem (AMBIEN) 5 MG tablet Take 5 mg by mouth at bedtime as needed for sleep.    Historical Provider, MD    Family History No family history on file.  Social History Social History  Substance Use  Topics  . Smoking status: Never Smoker  . Smokeless tobacco: Never Used  . Alcohol use Yes     Comment: 1-2 times per month     Allergies   Lorcet [hydrocodone-acetaminophen]; Penicillins; and Sulfa antibiotics   Review of Systems Review of Systems A complete 10 system review of systems was obtained and all systems are negative except as noted in the HPI and PMH.    Physical Exam Updated Vital Signs BP 169/75 (BP Location: Right Arm)   Pulse 68   Temp 98.4 F (36.9 C) (Oral)   Resp 18   Ht 5\' 4"  (1.626 m)   Wt 172 lb (78 kg)   SpO2 98%   BMI 29.52 kg/m   Physical Exam  Constitutional: She is oriented to person, place, and time. She appears well-developed and well-nourished.  HENT:  Head: Normocephalic.  Right Ear: Tympanic membrane and ear canal normal. No hemotympanum.  Left Ear: Tympanic membrane and ear canal normal. No hemotympanum.  Small hematoma to left forehead.  Eyes: EOM are normal. Pupils are equal, round, and reactive to light.  Neck: Normal range of motion.  Cardiovascular: Normal rate, regular rhythm and normal heart sounds.  Exam reveals no gallop and no friction rub.   No murmur heard. DP pulses 2+ bilaterally.  Pulmonary/Chest: Effort normal and breath sounds normal. No respiratory distress. She has no wheezes. She has no rales.  Musculoskeletal: Normal range of motion. She exhibits tenderness. She exhibits no edema or deformity.  Full ROM of bilateral wrists, elbows and shoulders. Full ROM of right knee. Bruising, tenderness to palpation and bruising of right anterior knee. Left knee with full ROM without pain. No tenderness to palpation to cervical, thoracic or lumbar spine.  Full ROM of all fingers of the left hand  Neurological: She is alert and oriented to person, place, and time. No cranial nerve deficit.  Distal sensations of bilateral hands and feet intact. Cranial nerves 2-12 intact. Muscle strength normal in BLE. Grip strength 5/5  bilaterally.  Skin: Skin is warm and dry.  Flap laceration to palmar aspect of left hand just distal to wrist. Flap laceration to radial aspect of left index finger. Bleeding controlled.  Psychiatric: She has a normal mood and affect. Her behavior is normal.  Nursing note and vitals reviewed.    ED Treatments / Results  DIAGNOSTIC STUDIES: Oxygen Saturation is 98% on RA, normal by my interpretation.   COORDINATION OF CARE: 2:49 PM- Will X-Ray left hand to check for retained foreign bodies. Will X-Ray right knee. Pt verbalizes understanding and agrees to plan.  Medications  lidocaine (PF) (XYLOCAINE) 1 % injection 5 mL (5 mLs Intradermal Given  by Other 07/28/16 1420)    Labs (all labs ordered are listed, but only abnormal results are displayed) Labs Reviewed - No data to display  EKG  EKG Interpretation None       Radiology No results found.  Procedures Procedures (including critical care time)  Medications Ordered in ED Medications  lidocaine (PF) (XYLOCAINE) 1 % injection 5 mL (5 mLs Intradermal Given by Other 07/28/16 1420)     Initial Impression / Assessment and Plan / ED Course  I have reviewed the triage vital signs and the nursing notes.  Pertinent labs & imaging results that were available during my care of the patient were reviewed by me and considered in my medical decision making (see chart for details).  Clinical Course   LACERATION REPAIR Performed by: Hyman Bible Authorized by: Hyman Bible Consent: Verbal consent obtained. Risks and benefits: risks, benefits and alternatives were discussed Consent given by: patient Patient identity confirmed: provided demographic data Prepped and Draped in normal sterile fashion Wound explored  Laceration Location: palmar aspect of the hand  Laceration Length: 2 cm  No Foreign Bodies seen or palpated  Anesthesia: local infiltration  Local anesthetic: lidocaine 2% without  epinephrine  Anesthetic total: 3 ml  Irrigation method: syringe Amount of cleaning: standard  Skin closure: 4-0 prolene  Number of sutures: 3  Technique: simple interrupted  Patient tolerance: Patient tolerated the procedure well with no immediate complications.  LACERATION REPAIR Performed by: Hyman Bible Authorized by: Hyman Bible Consent: Verbal consent obtained. Risks and benefits: risks, benefits and alternatives were discussed Consent given by: patient Patient identity confirmed: provided demographic data Prepped and Draped in normal sterile fashion Wound explored  Laceration Location: left index finger  Laceration Length: 1.5 cm  No Foreign Bodies seen or palpated  Anesthesia: local infiltration  Local anesthetic: lidocaine 2% without epinephrine  Anesthetic total: 2 ml  Irrigation method: syringe Amount of cleaning: standard  Skin closure: 4-0 Prolene  Number of sutures: 3  Technique: simple interrupted  Patient tolerance: Patient tolerated the procedure well with no immediate complications.   Final Clinical Impressions(s) / ED Diagnoses   Final diagnoses:  None   Patient presents today after a mechanical fall that occurred just PTA.  She does have small hematoma to the forehead.  She is not on anticoagulants.  Normal neurological exam.  Therefore, do not feel that head CT is indicated at this time.  Xray of the left knee is negative for acute findings.  Left hand xray showing small pieces of glass in the finger.  Wound irrigated very well prior to closure with sutures.  She is neurovascularly intact.  No apparent tendon damage.  Stable for discharge.  Return precautions given.   New Prescriptions New Prescriptions   No medications on file     Hyman Bible, PA-C 07/31/16 [place], MD 08/02/16 1323

## 2016-07-28 NOTE — ED Triage Notes (Signed)
Per Pt, Pt is coming from home where she tripped and slipped on a rug. Pt had a coffee cup in her left hand and it went through the left palm of the hand. Bleeding controlled. Pt is able to move fingers. Pulses present.

## 2016-07-28 NOTE — ED Notes (Signed)
Pt stable, understands discharge instructions, and reasons for return.   

## 2016-09-05 ENCOUNTER — Other Ambulatory Visit: Payer: Self-pay | Admitting: Dermatology

## 2016-09-15 ENCOUNTER — Ambulatory Visit (INDEPENDENT_AMBULATORY_CARE_PROVIDER_SITE_OTHER): Payer: Medicare Other | Admitting: Neurology

## 2016-09-15 DIAGNOSIS — G5139 Clonic hemifacial spasm, unspecified: Secondary | ICD-10-CM

## 2016-09-15 DIAGNOSIS — G513 Clonic hemifacial spasm: Secondary | ICD-10-CM | POA: Diagnosis not present

## 2016-09-15 DIAGNOSIS — G245 Blepharospasm: Secondary | ICD-10-CM | POA: Diagnosis not present

## 2016-09-15 MED ORDER — ONABOTULINUMTOXINA 100 UNITS IJ SOLR
23.0000 [IU] | Freq: Once | INTRAMUSCULAR | Status: AC
  Administered 2016-09-15: 23 [IU] via INTRAMUSCULAR

## 2016-09-15 NOTE — Procedures (Signed)
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  Risk discussed included, but were not limited pain and discomfort, bleeding, increased ptosis, increased facial asymmetry, bruising, excessive weakness, venous thrombosis, muscle atrophy and dysphagia.  A copy of the patient medication guide was given to the patient which explains the blackbox warning.  Patients identity and treatment sites confirmed Yes.  .  Details of Procedure: Skin was cleaned with alcohol.   Prior to injection, the needle plunger was aspirated to make sure the needle was not within a blood vessel.  There was no blood retrieved on aspiration.    Following is a summary of the muscles injected  And the amount of Botulinum toxin used:  Injections  Location Left  Right Units Number of sites        Corrugator      Frontalis      Lower Lid, Lateral 2.5 2.5 5.0 1 each side  Lower Lid Medial 2.5  2.5 1  Upper Lid, Lateral 2.5 2.5 5.0 1 each side  Upper Lid, Medial 2.5  2.5 1  Canthus 5.0 2.5 7.5 1 each side  Temporalis      Masseter      Procerus      Zygomaticus Major      TOTAL UNITS:   22.5    Agent: Botulinum Type A ( Onobotulinum Toxin type A ).  1 vials of Botox were used, each containing 50 units and freshly diluted with 2 mL of sterile, non-perserved saline   Total injected (Units): 22.5  Total wasted (Units): 2.5 Pt tolerated procedure well; no complications Reinjection is anticipated in 3 months.

## 2016-11-14 ENCOUNTER — Telehealth: Payer: Self-pay | Admitting: Neurology

## 2016-11-14 NOTE — Telephone Encounter (Signed)
Spoke with patient and she has changed insurance companies since last Botox injection. She will fax me insurance cards so I can get Botox approval.   She also states about a week ago her left eye started developing twitching again. She knows there is nothing to do about this right now- she has medication to take at night- she just wanted it noted in her chart.

## 2016-12-22 ENCOUNTER — Ambulatory Visit (INDEPENDENT_AMBULATORY_CARE_PROVIDER_SITE_OTHER): Payer: Medicare Other | Admitting: Neurology

## 2016-12-22 DIAGNOSIS — G245 Blepharospasm: Secondary | ICD-10-CM

## 2016-12-22 DIAGNOSIS — G5139 Clonic hemifacial spasm, unspecified: Secondary | ICD-10-CM

## 2016-12-22 DIAGNOSIS — G513 Clonic hemifacial spasm: Secondary | ICD-10-CM

## 2016-12-22 MED ORDER — ONABOTULINUMTOXINA 100 UNITS IJ SOLR
25.0000 [IU] | Freq: Once | INTRAMUSCULAR | Status: AC
  Administered 2016-12-22: 25 [IU] via INTRAMUSCULAR

## 2016-12-22 NOTE — Procedures (Signed)
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  Risk discussed included, but were not limited pain and discomfort, bleeding, increased ptosis, increased facial asymmetry, bruising, excessive weakness, venous thrombosis, muscle atrophy and dysphagia.  A copy of the patient medication guide was given to the patient which explains the blackbox warning.  Patients identity and treatment sites confirmed Yes.  .  Details of Procedure: Skin was cleaned with alcohol.   Prior to injection, the needle plunger was aspirated to make sure the needle was not within a blood vessel.  There was no blood retrieved on aspiration.    Following is a summary of the muscles injected  And the amount of Botulinum toxin used:  Injections  Location Left  Right Units Number of sites        Corrugator      Frontalis      Lower Lid, Lateral 2.5 2.5 5.0 1 each side  Lower Lid Medial 2.5  2.5 1  Upper Lid, Lateral 2.5 2.5 5.0 1 each side  Upper Lid, Medial 2.5  2.5 1  Canthus 5.0 2.5 7.5 1 each side  Temporalis      Masseter      Procerus      Zygomaticus Major      TOTAL UNITS:   22.5    Agent: Botulinum Type A ( Onobotulinum Toxin type A ).  1 vials of Botox were used, each containing 50 units and freshly diluted with 2 mL of sterile, non-perserved saline   Total injected (Units): 22.5  Total wasted (Units): 40.0 Pt tolerated procedure well; no complications Reinjection is anticipated in 3 months.

## 2017-03-14 ENCOUNTER — Other Ambulatory Visit: Payer: Self-pay | Admitting: Dermatology

## 2017-03-14 DIAGNOSIS — C4492 Squamous cell carcinoma of skin, unspecified: Secondary | ICD-10-CM

## 2017-03-14 DIAGNOSIS — Z859 Personal history of malignant neoplasm, unspecified: Secondary | ICD-10-CM

## 2017-03-14 HISTORY — DX: Personal history of malignant neoplasm, unspecified: Z85.9

## 2017-03-14 HISTORY — DX: Squamous cell carcinoma of skin, unspecified: C44.92

## 2017-03-15 ENCOUNTER — Ambulatory Visit (INDEPENDENT_AMBULATORY_CARE_PROVIDER_SITE_OTHER): Payer: Medicare Other | Admitting: Neurology

## 2017-03-15 DIAGNOSIS — G513 Clonic hemifacial spasm: Secondary | ICD-10-CM

## 2017-03-15 DIAGNOSIS — G245 Blepharospasm: Secondary | ICD-10-CM

## 2017-03-15 DIAGNOSIS — G5139 Clonic hemifacial spasm, unspecified: Secondary | ICD-10-CM

## 2017-03-15 MED ORDER — ONABOTULINUMTOXINA 100 UNITS IJ SOLR
25.0000 [IU] | Freq: Once | INTRAMUSCULAR | Status: AC
  Administered 2017-03-15: 25 [IU] via INTRAMUSCULAR

## 2017-03-15 NOTE — Procedures (Signed)
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  Risk discussed included, but were not limited pain and discomfort, bleeding, increased ptosis, increased facial asymmetry, bruising, excessive weakness, venous thrombosis, muscle atrophy and dysphagia.  A copy of the patient medication guide was given to the patient which explains the blackbox warning.  Patients identity and treatment sites confirmed Yes.  .  Details of Procedure: Skin was cleaned with alcohol.   Prior to injection, the needle plunger was aspirated to make sure the needle was not within a blood vessel.  There was no blood retrieved on aspiration.    Following is a summary of the muscles injected  And the amount of Botulinum toxin used:  Injections  Location Left  Right Units Number of sites        Corrugator      Frontalis      Lower Lid, Lateral 2.5 2.5 5.0 1 each side  Lower Lid Medial 2.5  2.5 1  Upper Lid, Lateral 2.5 2.5 5.0 1 each side  Upper Lid, Medial 2.5  2.5 1  Canthus 5.0 2.5 7.5 1 each side  Temporalis      Masseter      Procerus      Zygomaticus Major      TOTAL UNITS:   22.5    Agent: Botulinum Type A ( Onobotulinum Toxin type A ).  1 vials of Botox were used, each containing 50 units and freshly diluted with 2 mL of sterile, non-perserved saline   Total injected (Units): 22.5  Total wasted (Units): 10 Pt tolerated procedure well; no complications Reinjection is anticipated in 3 months.

## 2017-04-17 ENCOUNTER — Emergency Department (HOSPITAL_COMMUNITY): Payer: Medicare Other

## 2017-04-17 ENCOUNTER — Encounter (HOSPITAL_COMMUNITY): Payer: Self-pay | Admitting: Emergency Medicine

## 2017-04-17 ENCOUNTER — Emergency Department (HOSPITAL_COMMUNITY)
Admission: EM | Admit: 2017-04-17 | Discharge: 2017-04-17 | Disposition: A | Discharge status: Home / Self Care | Payer: Medicare Other | Attending: Emergency Medicine | Admitting: Emergency Medicine

## 2017-04-17 DIAGNOSIS — Z79899 Other long term (current) drug therapy: Secondary | ICD-10-CM | POA: Diagnosis not present

## 2017-04-17 DIAGNOSIS — Y999 Unspecified external cause status: Secondary | ICD-10-CM | POA: Diagnosis not present

## 2017-04-17 DIAGNOSIS — Y9301 Activity, walking, marching and hiking: Secondary | ICD-10-CM | POA: Diagnosis not present

## 2017-04-17 DIAGNOSIS — S0083XA Contusion of other part of head, initial encounter: Secondary | ICD-10-CM | POA: Insufficient documentation

## 2017-04-17 DIAGNOSIS — S6992XA Unspecified injury of left wrist, hand and finger(s), initial encounter: Secondary | ICD-10-CM | POA: Diagnosis present

## 2017-04-17 DIAGNOSIS — W1830XA Fall on same level, unspecified, initial encounter: Secondary | ICD-10-CM | POA: Insufficient documentation

## 2017-04-17 DIAGNOSIS — Y92002 Bathroom of unspecified non-institutional (private) residence single-family (private) house as the place of occurrence of the external cause: Secondary | ICD-10-CM | POA: Insufficient documentation

## 2017-04-17 DIAGNOSIS — S066X1A Traumatic subarachnoid hemorrhage with loss of consciousness of 30 minutes or less, initial encounter: Secondary | ICD-10-CM | POA: Diagnosis not present

## 2017-04-17 DIAGNOSIS — S52602A Unspecified fracture of lower end of left ulna, initial encounter for closed fracture: Secondary | ICD-10-CM | POA: Insufficient documentation

## 2017-04-17 DIAGNOSIS — I10 Essential (primary) hypertension: Secondary | ICD-10-CM | POA: Insufficient documentation

## 2017-04-17 DIAGNOSIS — Z96653 Presence of artificial knee joint, bilateral: Secondary | ICD-10-CM | POA: Insufficient documentation

## 2017-04-17 DIAGNOSIS — Y92009 Unspecified place in unspecified non-institutional (private) residence as the place of occurrence of the external cause: Secondary | ICD-10-CM

## 2017-04-17 DIAGNOSIS — W19XXXA Unspecified fall, initial encounter: Secondary | ICD-10-CM

## 2017-04-17 DIAGNOSIS — S52502A Unspecified fracture of the lower end of left radius, initial encounter for closed fracture: Secondary | ICD-10-CM | POA: Insufficient documentation

## 2017-04-17 LAB — CBC
HCT: 39.8 % (ref 36.0–46.0)
Hemoglobin: 12.8 g/dL (ref 12.0–15.0)
MCH: 29.3 pg (ref 26.0–34.0)
MCHC: 32.2 g/dL (ref 30.0–36.0)
MCV: 91.1 fL (ref 78.0–100.0)
Platelets: 186 K/uL (ref 150–400)
RBC: 4.37 MIL/uL (ref 3.87–5.11)
RDW: 14.3 % (ref 11.5–15.5)
WBC: 7.4 K/uL (ref 4.0–10.5)

## 2017-04-17 LAB — BASIC METABOLIC PANEL
ANION GAP: 8 (ref 5–15)
BUN: 12 mg/dL (ref 6–20)
CALCIUM: 8.9 mg/dL (ref 8.9–10.3)
CHLORIDE: 105 mmol/L (ref 101–111)
CO2: 28 mmol/L (ref 22–32)
Creatinine, Ser: 0.7 mg/dL (ref 0.44–1.00)
GFR calc Af Amer: >60 mL/min (ref >60)
GFR calc non Af Amer: >60 mL/min (ref >60)
GLUCOSE: 126 mg/dL — AB (ref 65–99)
Potassium: 4 mmol/L (ref 3.5–5.1)
Sodium: 141 mmol/L (ref 135–145)

## 2017-04-17 LAB — DIFFERENTIAL
BASOS PCT: 0 %
Basophils Absolute: 0 10*3/uL (ref 0.0–0.1)
EOS ABS: 0.4 10*3/uL (ref 0.0–0.7)
EOS PCT: 6 %
Lymphocytes Relative: 13 %
Lymphs Abs: 1 10*3/uL (ref 0.7–4.0)
MONO ABS: 0.6 10*3/uL (ref 0.1–1.0)
Monocytes Relative: 8 %
NEUTROS ABS: 5.4 10*3/uL (ref 1.7–7.7)
Neutrophils Relative %: 73 %

## 2017-04-17 LAB — CBG MONITORING, ED: GLUCOSE-CAPILLARY: 126 mg/dL — AB (ref 65–99)

## 2017-04-17 MED ORDER — OXYCODONE-ACETAMINOPHEN 5-325 MG PO TABS: 1.0000 | ORAL_TABLET | ORAL | 0 refills | Status: DC | PRN

## 2017-04-17 MED ORDER — MORPHINE SULFATE (PF) 4 MG/ML IV SOLN
4.0000 mg | Freq: Once | INTRAVENOUS | Status: AC
  Administered 2017-04-17: 4 mg via INTRAVENOUS
  Filled 2017-04-17: qty 1

## 2017-04-17 MED ORDER — OXYCODONE-ACETAMINOPHEN 5-325 MG PO TABS
1.0000 | ORAL_TABLET | Freq: Once | ORAL | Status: AC
Start: 2017-04-17 — End: 2017-04-17
  Administered 2017-04-17: 1 via ORAL
  Filled 2017-04-17: qty 1

## 2017-04-17 NOTE — ED Notes (Signed)
Spoke with case management, made CM aware of patients dx and needs. CM to f/u with patient by phone. Confirmed correct address and phone number in EPIC.

## 2017-04-17 NOTE — Progress Notes (Signed)
ED CM contacted patient concerning recommendation for [facility] services. CM explained HH purpose and criteria for [facility] services. Patient declined, CM agrees at this time patient does not met the criteria for [facility] services. Patient has follow appt today with Ortho, CM explained that Ortho or PCP can order [facility] if patient should need [place] service. Patient verbalized understanding and teach back done. No further ED CM needs identified.

## 2017-04-17 NOTE — ED Triage Notes (Addendum)
Pt BIB from home. Patient woke up to use the restroom and woke up on the floor next to her bed some time later. Positive LOC. NSR per EMS. CBG 98 per EMS. Hematoma to left face. Patient attempted to crawl back into bed, was successful, then noticed her left wrist. Deformity noted. No blood thinners. No neck or back pain. 20g right hand, 100 mcg fentanyl en route. Patient takes Lorrin Mais and has for years.

## 2017-04-17 NOTE — ED Notes (Signed)
Pt awaiting case management and then d/c. Pt requests additional pain meds prior to d/c.

## 2017-04-17 NOTE — ED Provider Notes (Signed)
[facility] DEPT Provider Note   CSN: [id number] Arrival date & time: 04/17/17  0354     History   Chief Complaint Chief Complaint  Patient presents with  . Fall  . Loss of Consciousness    HPI [name] is a 66 y.o. female.  The history is provided by the patient.  She apparently fell while walking back from the bathroom. She does not recall anything that happened. She did injure her left wrist. She was brought in by EMS who gave her fentanyl for pain. She currently rates pain at 3/10. She denies chest pain, heaviness, tightness, pressure. She denies dyspnea, nausea, vomiting, diaphoresis.  Past Medical History:  Diagnosis Date  . B12 deficiency   . Chronic pain   . History of shingles   . Hypertension   . Osteoarthritis     Patient Active Problem List   Diagnosis Date Noted  . Hemifacial spasm 06/03/2014    Past Surgical History:  Procedure Laterality Date  . ABDOMINAL HYSTERECTOMY    . ANKLE SURGERY Right   . BOTOX INJECTION     facial  . FACIAL COSMETIC SURGERY    . GASTRIC BYPASS    . REPLACEMENT TOTAL KNEE Bilateral     OB History    No data available       Home Medications    Prior to Admission medications   Medication Sig Start Date End Date Taking? Authorizing Provider  Ascorbic Acid (VITAMIN C PO) Take 1 tablet by mouth daily as needed (immune support).    Yes [provider]  BIOTIN PO Take 1 tablet by mouth daily after breakfast.    Yes [provider]  Calcium Carbonate-Vitamin D (CALCIUM + D PO) Take 1 tablet by mouth every evening.    Yes [provider]  Cyanocobalamin (VITAMIN B-12 PO) Take 5,000 Units by mouth daily after breakfast.    Yes [provider]  felodipine (PLENDIL) 5 MG 24 hr tablet Take 5 mg by mouth daily after breakfast.    Yes [provider]  Ferrous Sulfate (IRON) 28 MG TABS Take 1 tablet by mouth daily after breakfast.    Yes [provider]  fluticasone  (FLONASE) 50 MCG/ACT nasal spray Place 1 spray into both nostrils 2 (two) times daily.    Yes [provider]  gabapentin (NEURONTIN) 600 MG tablet Take 600 mg by mouth 2 (two) times daily.    Yes [provider]  levocetirizine (XYZAL) 5 MG tablet Take 5 mg by mouth daily after breakfast.  05/31/16  Yes [provider]  lidocaine (LIDODERM) 5 % Place 1 patch onto the skin daily. Remove & Discard patch within 12 hours or as directed by MD   Yes [provider]  lisinopril (PRINIVIL,ZESTRIL) 10 MG tablet Take 10 mg by mouth daily after breakfast.    Yes [provider]  LORazepam (ATIVAN) 0.5 MG tablet Take 0.5 mg by mouth every 8 (eight) hours as needed for anxiety.    Yes [provider]  meloxicam (MOBIC) 7.5 MG tablet Take 7.5 mg by mouth daily after breakfast.    Yes [provider]  montelukast (SINGULAIR) 10 MG tablet Take 10 mg by mouth daily after breakfast.  05/31/16  Yes [provider]  Multiple Vitamin (MULTIVITAMIN) tablet Take 1 tablet by mouth daily.   Yes [provider]  traMADol (ULTRAM) 50 MG tablet Take 50 mg by mouth every 6 (six) hours as needed for moderate pain  or severe pain.    Yes [provider]  zolpidem (AMBIEN) 5 MG tablet Take 5 mg by mouth at bedtime as needed for sleep.   Yes [provider]  baclofen (LIORESAL) 10 MG tablet Take 1 tablet (10 mg total) by mouth 2 (two) times daily. Patient not taking: Reported on 04/17/2017 04/17/16   Tat, Eustace Quail, DO    Family History History reviewed. No pertinent family history.  Social History Social History  Substance Use Topics  . Smoking status: Never Smoker  . Smokeless tobacco: Never Used  . Alcohol use Yes     Comment: 1-2 times per month     Allergies   Lorcet [hydrocodone-acetaminophen]; Penicillins; and Sulfa antibiotics   Review of Systems Review of Systems  All other systems reviewed and are  negative.    Physical Exam Updated Vital Signs BP (!) 155/85   Pulse 68   Resp 12   SpO2 94%   Physical Exam  Nursing note and vitals reviewed.  66 year old female, resting comfortably and in no acute distress. Vital signs are significant for hypertension. Oxygen saturation is 97%, which is normal. Head is normocephalic. Ecchymosis and tenderness present in the left periorbital area. PERRLA, EOMI. Oropharynx is clear. Neck is immobilized in a stiff cervical collar and is nontender  without adenopathy or JVD. Back is nontender and there is no CVA tenderness. Lungs are clear without rales, wheezes, or rhonchi. Chest is nontender. Heart has regular rate and rhythm without murmur. Abdomen is soft, flat, nontender without masses or hepatosplenomegaly and peristalsis is normoactive. Extremities: Mild deformity of the left wrist present consistent with Colles' fracture. There is tenderness palpation in this area. Distal neurovascular exam is intact with prompt capillary refill, normal sensation, normal movement of intrinsic muscles of the hand. No other extremity injury is seen. Skin is warm and dry without rash. Neurologic: Mental status is normal, cranial nerves are intact, there are no motor or sensory deficits.  ED Treatments / Results  Labs (all labs ordered are listed, but only abnormal results are displayed) Labs Reviewed  BASIC METABOLIC PANEL - Abnormal; Notable for the following:       Result Value   Glucose, Bld 126 (*)    All other components within normal limits  CBG MONITORING, ED - Abnormal; Notable for the following:    Glucose-Capillary 126 (*)    All other components within normal limits  CBC  DIFFERENTIAL  URINALYSIS, ROUTINE W REFLEX MICROSCOPIC    EKG  EKG Interpretation  Date/Time:  Tuesday April 17 2017 04:30:38 EDT Ventricular Rate:  66 PR Interval:    QRS Duration: 96 QT Interval:  432 QTC Calculation: 453 R Axis:   57 Text Interpretation:  Sinus  rhythm Normal ECG When compared with ECG of 08/05/2010, No significant change was found Confirmed by Delora Fuel ([id number]) on 04/17/2017 4:54:43 AM       Radiology Dg Wrist Complete Left  Result Date: 04/17/2017 CLINICAL DATA:  [place] [address] this morning on way to bathroom. EXAM: LEFT WRIST - COMPLETE 3+ VIEW COMPARISON:  LEFT hand radiograph July 28, 2016 FINDINGS: Acute mildly comminuted distal radial fracture with dorsal angulation distal bony fragments. Nondisplaced acute ulnar styloid fracture. No dislocation. Severe similar first carpometacarpal joint space narrowing, periarticular sclerosis and subluxation consistent with osteoarthrosis. Osteopenia without destructive bony lesions. Soft tissue swelling without subcutaneous gas or radiopaque foreign bodies. IMPRESSION: Acute displaced distal radial fracture. Nondisplaced acute ulnar styloid fracture. No dislocation. Electronically Signed  By: Elon Alas M.D.   On: 04/17/2017 04:47   Ct Head Wo Contrast  Result Date: 04/17/2017 CLINICAL DATA:  66 year old female with fall. EXAM: CT HEAD WITHOUT CONTRAST CT MAXILLOFACIAL WITHOUT CONTRAST CT CERVICAL SPINE WITHOUT CONTRAST TECHNIQUE: Multidetector CT imaging of the head, cervical spine, and maxillofacial structures were performed using the standard protocol without intravenous contrast. Multiplanar CT image reconstructions of the cervical spine and maxillofacial structures were also generated. COMPARISON:  None. FINDINGS: CT HEAD FINDINGS Brain: There is small subarachnoid hemorrhage in the left temporal lobe and left sylvian fissure. Small amount of subarachnoid hemorrhage is also noted in the anterior right frontal lobe. No other acute intracranial hemorrhage identified. The ventricles and sulci appropriate in size for patient's age. The gray-white matter discrimination is preserved. There is no mass effect or midline shift. Vascular: No hyperdense vessel or unexpected calcification. Skull: Normal.  Negative for fracture or focal lesion. Other: None CT MAXILLOFACIAL FINDINGS Osseous: No acute fracture. Orbits: Negative. No traumatic or inflammatory finding. Sinuses: Clear. Soft tissues: Left periorbital contusion. CT CERVICAL SPINE FINDINGS Alignment: No acute subluxation. Skull base and vertebrae: No acute fracture. No primary bone lesion or focal pathologic process. Soft tissues and spinal canal: No prevertebral fluid or swelling. No visible canal hematoma. Disc levels:  Degenerative changes primarily at C5-C6. Upper chest: Mild emphysematous changes. Other: None IMPRESSION: 1. Small bilateral subarachnoid hemorrhages primarily involving the left sylvian fissure and right frontal lobe. No mass effect or midline shift. 2. No acute/traumatic cervical spine pathology. 3. No acute facial bone fractures. These results were called by telephone at the time of interpretation on 04/17/2017 at 5:09 am to Dr. Delora Fuel , who verbally acknowledged these results. Electronically Signed   By: Anner Crete M.D.   On: 04/17/2017 05:13   Ct Cervical Spine Wo Contrast  Result Date: 04/17/2017 CLINICAL DATA:  66 year old female with fall. EXAM: CT HEAD WITHOUT CONTRAST CT MAXILLOFACIAL WITHOUT CONTRAST CT CERVICAL SPINE WITHOUT CONTRAST TECHNIQUE: Multidetector CT imaging of the head, cervical spine, and maxillofacial structures were performed using the standard protocol without intravenous contrast. Multiplanar CT image reconstructions of the cervical spine and maxillofacial structures were also generated. COMPARISON:  None. FINDINGS: CT HEAD FINDINGS Brain: There is small subarachnoid hemorrhage in the left temporal lobe and left sylvian fissure. Small amount of subarachnoid hemorrhage is also noted in the anterior right frontal lobe. No other acute intracranial hemorrhage identified. The ventricles and sulci appropriate in size for patient's age. The gray-white matter discrimination is preserved. There is no mass  effect or midline shift. Vascular: No hyperdense vessel or unexpected calcification. Skull: Normal. Negative for fracture or focal lesion. Other: None CT MAXILLOFACIAL FINDINGS Osseous: No acute fracture. Orbits: Negative. No traumatic or inflammatory finding. Sinuses: Clear. Soft tissues: Left periorbital contusion. CT CERVICAL SPINE FINDINGS Alignment: No acute subluxation. Skull base and vertebrae: No acute fracture. No primary bone lesion or focal pathologic process. Soft tissues and spinal canal: No prevertebral fluid or swelling. No visible canal hematoma. Disc levels:  Degenerative changes primarily at C5-C6. Upper chest: Mild emphysematous changes. Other: None IMPRESSION: 1. Small bilateral subarachnoid hemorrhages primarily involving the left sylvian fissure and right frontal lobe. No mass effect or midline shift. 2. No acute/traumatic cervical spine pathology. 3. No acute facial bone fractures. These results were called by telephone at the time of interpretation on 04/17/2017 at 5:09 am to Dr. Delora Fuel , who verbally acknowledged these results. Electronically Signed   By: Laren Everts.D.  On: 04/17/2017 05:13   Ct Maxillofacial Wo Contrast  Result Date: 04/17/2017 CLINICAL DATA:  66 year old female with fall. EXAM: CT HEAD WITHOUT CONTRAST CT MAXILLOFACIAL WITHOUT CONTRAST CT CERVICAL SPINE WITHOUT CONTRAST TECHNIQUE: Multidetector CT imaging of the head, cervical spine, and maxillofacial structures were performed using the standard protocol without intravenous contrast. Multiplanar CT image reconstructions of the cervical spine and maxillofacial structures were also generated. COMPARISON:  None. FINDINGS: CT HEAD FINDINGS Brain: There is small subarachnoid hemorrhage in the left temporal lobe and left sylvian fissure. Small amount of subarachnoid hemorrhage is also noted in the anterior right frontal lobe. No other acute intracranial hemorrhage identified. The ventricles and sulci appropriate  in size for patient's age. The gray-white matter discrimination is preserved. There is no mass effect or midline shift. Vascular: No hyperdense vessel or unexpected calcification. Skull: Normal. Negative for fracture or focal lesion. Other: None CT MAXILLOFACIAL FINDINGS Osseous: No acute fracture. Orbits: Negative. No traumatic or inflammatory finding. Sinuses: Clear. Soft tissues: Left periorbital contusion. CT CERVICAL SPINE FINDINGS Alignment: No acute subluxation. Skull base and vertebrae: No acute fracture. No primary bone lesion or focal pathologic process. Soft tissues and spinal canal: No prevertebral fluid or swelling. No visible canal hematoma. Disc levels:  Degenerative changes primarily at C5-C6. Upper chest: Mild emphysematous changes. Other: None IMPRESSION: 1. Small bilateral subarachnoid hemorrhages primarily involving the left sylvian fissure and right frontal lobe. No mass effect or midline shift. 2. No acute/traumatic cervical spine pathology. 3. No acute facial bone fractures. These results were called by telephone at the time of interpretation on 04/17/2017 at 5:09 am to Dr. Delora Fuel , who verbally acknowledged these results. Electronically Signed   By: Anner Crete M.D.   On: 04/17/2017 05:13    Procedures Procedures (including critical care time) SPLINT APPLICATION Date/Time: 2:95 AM Authorized by: [email or web address],[id number] Consent: Verbal consent obtained. Risks and benefits: risks, benefits and alternatives were discussed Consent given by: patient Splint applied by: orthopedic technician Location details: Left forearm  Splint type: Sugar tong  Supplies used: Cast padding, with a glass splint material, elastic bandage  Post-procedure: The splinted body part was neurovascularly unchanged following the procedure. Patient tolerance: Patient tolerated the procedure well with no immediate complications.   Medications Ordered in ED Medications  morphine 4 MG/ML injection 4 mg (4 mg  Intravenous Given 04/17/17 0508)     Initial Impression / Assessment and Plan / ED Course  I have reviewed the triage vital signs and the nursing notes.  Pertinent labs & imaging results that were available during my care of the patient were reviewed by me and considered in my medical decision making (see chart for details).  Fall with injury to left wrist and contusion in the left periorbital area. I do not feel this represents syncope. She is sent for x-rays of wrist, and CT of head and cervical spine and maxillofacial. Old records are reviewed, and she has no relevant past visits.  Wrist x-ray shows evidence of fracture distal radius and ulna. CT of head shows very small subarachnoid hemorrhage. CT of cervical spine is unremarkable. Case is discussed with Dr. Arnoldo Morale of neurosurgery service who has reviewed her CT scans. She is not on any anticoagulants, so he feels that she does not need follow-up scans unless she has ongoing symptoms. Sugar tong splint is applied to immobilize the fracture, and she is referred to hand surgery for follow-up. Given prescription for oxycodone have acetaminophen.  Final Clinical Impressions(s) / ED Diagnoses  Final diagnoses:  Fall at home, initial encounter  Closed fracture of left distal radius and ulna, initial encounter  Traumatic subarachnoid hemorrhage with loss of consciousness of 30 minutes or less, initial encounter ([place])  Contusion of face, initial encounter    New Prescriptions New Prescriptions   OXYCODONE-ACETAMINOPHEN (PERCOCET) 5-325 MG TABLET    Take 1 tablet by mouth every 4 (four) hours as needed for moderate pain.     Delora Fuel, MD 72/89/79 [phone]

## 2017-04-17 NOTE — ED Notes (Signed)
Bed: AS50 Expected date:  Expected time:  Means of arrival:  Comments: EMS syncopal episode/positive LOC/wrist deformity

## 2017-04-17 NOTE — Discharge Instructions (Signed)
You need to follow up with the hand specialist - call today for an appointment. Tell them you were referred from the ED.  Your CT scan showed some slight bleeding around the brain. This will probably resolve without any problems. However, if you develop headaches, or if you develop any of the signs mentioned in the head injury instructions, then you should have a repeat CT scan. If that happens, Dr. Felipa Eth can order it, or you can see Dr. Arnoldo Morale, or you can return to the ED.

## 2017-04-20 ENCOUNTER — Telehealth: Payer: Self-pay | Admitting: Neurology

## 2017-04-20 NOTE — Telephone Encounter (Signed)
I did look and reviewed and agree with report.  I think that she knows (it was on her AVS) and in ER notes that there was a very small amount of bleeding in the brain.  If any mental status change, falls, weakness, speech change, any focal/lateralizing neuro sx's then go back to the ER.  The ER also indicated that they spoke with neurosx Dr. Arnoldo Morale and if she is concerned she can f/u with him but doesn't need to if doing well.

## 2017-04-20 NOTE — Telephone Encounter (Signed)
Caller: Aziya  Urgent? No  Reason for the call: Sh called in to say that she had a bad fall on Monday night 04/16/17. She went to [facility] . She would like Dr. Carles Collet to please review her CT scan and make sure everything looks ok and to read the report. She said she did have a head injury. Please Advise. Thanks

## 2017-04-20 NOTE — Telephone Encounter (Signed)
Dr. Carles Collet please see notes.

## 2017-04-20 NOTE — Telephone Encounter (Signed)
Patient made aware.

## 2017-05-30 ENCOUNTER — Telehealth: Payer: Self-pay | Admitting: Neurology

## 2017-05-30 NOTE — Telephone Encounter (Signed)
Patient wants to talk to someone about when she could have her botox injection  She will be out of town on 06-08-17 but did not want to cancel her appt now

## 2017-05-30 NOTE — Telephone Encounter (Signed)
Next available Botox day is November. Dr. Carles Collet is talking about opening up an October day, but for now if she can not make appt in September she would have to move to November. Thanks.

## 2017-06-08 ENCOUNTER — Ambulatory Visit: Payer: Medicare Other | Admitting: Neurology

## 2017-08-10 ENCOUNTER — Ambulatory Visit (INDEPENDENT_AMBULATORY_CARE_PROVIDER_SITE_OTHER): Payer: Medicare Other | Admitting: Neurology

## 2017-08-10 DIAGNOSIS — G245 Blepharospasm: Secondary | ICD-10-CM

## 2017-08-10 DIAGNOSIS — G5139 Clonic hemifacial spasm, unspecified: Secondary | ICD-10-CM | POA: Diagnosis not present

## 2017-08-10 MED ORDER — ONABOTULINUMTOXINA 100 UNITS IJ SOLR
22.5000 [IU] | Freq: Once | INTRAMUSCULAR | Status: AC
  Administered 2017-08-10: 22.5 [IU] via INTRAMUSCULAR

## 2017-08-10 NOTE — Procedures (Signed)
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  Risk discussed included, but were not limited pain and discomfort, bleeding, increased ptosis, increased facial asymmetry, bruising, excessive weakness, venous thrombosis, muscle atrophy and dysphagia.  A copy of the patient medication guide was given to the patient which explains the blackbox warning.  Patients identity and treatment sites confirmed Yes.  .  Details of Procedure: Skin was cleaned with alcohol.   Prior to injection, the needle plunger was aspirated to make sure the needle was not within a blood vessel.  There was no blood retrieved on aspiration.    Following is a summary of the muscles injected  And the amount of Botulinum toxin used:  Injections  Location Left  Right Units Number of sites        Corrugator      Frontalis      Lower Lid, Lateral 2.5 2.5 5.0 1 each side  Lower Lid Medial 2.5  2.5 1  Upper Lid, Lateral 2.5 2.5 5.0 1 each side  Upper Lid, Medial 2.5  2.5 1  Canthus 5.0 2.5 7.5 1 each side  Temporalis      Masseter      Procerus      Zygomaticus Major      TOTAL UNITS:   22.5    Agent: Botulinum Type A ( Onobotulinum Toxin type A ).  1 vials of Botox were used, each containing 50 units and freshly diluted with 2 mL of sterile, non-perserved saline   Total injected (Units): 22.5  Total wasted (Units): 17.5 Pt tolerated procedure well; no complications Reinjection is anticipated in 3 months.

## 2017-11-09 ENCOUNTER — Ambulatory Visit (INDEPENDENT_AMBULATORY_CARE_PROVIDER_SITE_OTHER): Payer: Medicare Other | Admitting: Neurology

## 2017-11-09 DIAGNOSIS — G245 Blepharospasm: Secondary | ICD-10-CM | POA: Diagnosis not present

## 2017-11-09 DIAGNOSIS — G5139 Clonic hemifacial spasm, unspecified: Secondary | ICD-10-CM

## 2017-11-09 MED ORDER — ONABOTULINUMTOXINA 100 UNITS IJ SOLR
22.5000 [IU] | Freq: Once | INTRAMUSCULAR | Status: AC
Start: 2017-11-09 — End: 2017-11-09
  Administered 2017-11-09: 22.5 [IU] via INTRAMUSCULAR

## 2017-11-09 NOTE — Procedures (Signed)
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  Risk discussed included, but were not limited pain and discomfort, bleeding, increased ptosis, increased facial asymmetry, bruising, excessive weakness, venous thrombosis, muscle atrophy and dysphagia.  A copy of the patient medication guide was given to the patient which explains the blackbox warning.  Patients identity and treatment sites confirmed Yes.  .  Details of Procedure: Skin was cleaned with alcohol.   Prior to injection, the needle plunger was aspirated to make sure the needle was not within a blood vessel.  There was no blood retrieved on aspiration.    Following is a summary of the muscles injected  And the amount of Botulinum toxin used:  Injections  Location Left  Right Units Number of sites        Corrugator      Frontalis      Lower Lid, Lateral 2.5 2.5 5.0 1 each side  Lower Lid Medial 2.5  2.5 1  Upper Lid, Lateral 2.5 2.5 5.0 1 each side  Upper Lid, Medial 2.5  2.5 1  Canthus 5.0 2.5 7.5 1 each side  Temporalis      Masseter      Procerus      Zygomaticus Major      TOTAL UNITS:   22.5    Agent: Botulinum Type A ( Onobotulinum Toxin type A ).  1 vials of Botox were used, each containing 50 units and freshly diluted with 2 mL of sterile, non-perserved saline   Total injected (Units): 22.5  Total wasted (Units): 7.5 Pt tolerated procedure well; no complications Reinjection is anticipated in 3 months.

## 2018-02-08 ENCOUNTER — Ambulatory Visit (INDEPENDENT_AMBULATORY_CARE_PROVIDER_SITE_OTHER): Payer: Medicare Other | Admitting: Neurology

## 2018-02-08 DIAGNOSIS — G245 Blepharospasm: Secondary | ICD-10-CM | POA: Diagnosis not present

## 2018-02-08 DIAGNOSIS — G5139 Clonic hemifacial spasm, unspecified: Secondary | ICD-10-CM

## 2018-02-08 MED ORDER — ONABOTULINUMTOXINA 100 UNITS IJ SOLR
22.5000 [IU] | Freq: Once | INTRAMUSCULAR | Status: AC
  Administered 2018-02-08: 22.5 [IU] via INTRAMUSCULAR

## 2018-02-08 NOTE — Procedures (Signed)
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  Risk discussed included, but were not limited pain and discomfort, bleeding, increased ptosis, increased facial asymmetry, bruising, excessive weakness, venous thrombosis, muscle atrophy and dysphagia.  A copy of the patient medication guide was given to the patient which explains the blackbox warning.  Patients identity and treatment sites confirmed Yes.  .  Details of Procedure: Skin was cleaned with alcohol.   Prior to injection, the needle plunger was aspirated to make sure the needle was not within a blood vessel.  There was no blood retrieved on aspiration.    Following is a summary of the muscles injected  And the amount of Botulinum toxin used:  Injections  Location Left  Right Units Number of sites        Corrugator      Frontalis      Lower Lid, Lateral 2.5 2.5 5.0 1 each side  Lower Lid Medial 2.5  2.5 1  Upper Lid, Lateral 2.5 2.5 5.0 1 each side  Upper Lid, Medial 2.5  2.5 1  Canthus 5.0 2.5 7.5 1 each side  Temporalis      Masseter      Procerus      Zygomaticus Major      TOTAL UNITS:   22.5    Agent: Botulinum Type A ( Onobotulinum Toxin type A ).  1 vials of Botox were used, each containing 50 units and freshly diluted with 2 mL of sterile, non-perserved saline   Total injected (Units): 22.5  Total wasted (Units): 0 Pt tolerated procedure well; no complications Reinjection is anticipated in 3 months.

## 2018-05-17 ENCOUNTER — Ambulatory Visit (INDEPENDENT_AMBULATORY_CARE_PROVIDER_SITE_OTHER): Payer: Medicare Other | Admitting: Neurology

## 2018-05-17 DIAGNOSIS — G245 Blepharospasm: Secondary | ICD-10-CM | POA: Diagnosis not present

## 2018-05-17 DIAGNOSIS — G5139 Clonic hemifacial spasm, unspecified: Secondary | ICD-10-CM | POA: Diagnosis not present

## 2018-05-17 MED ORDER — ONABOTULINUMTOXINA 100 UNITS IJ SOLR
27.5000 [IU] | Freq: Once | INTRAMUSCULAR | Status: AC
  Administered 2018-05-17: 27.5 [IU] via INTRAMUSCULAR

## 2018-05-17 NOTE — Procedures (Signed)
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  Risk discussed included, but were not limited pain and discomfort, bleeding, increased ptosis, increased facial asymmetry, bruising, excessive weakness, venous thrombosis, muscle atrophy and dysphagia.  A copy of the patient medication guide was given to the patient which explains the blackbox warning.  Patients identity and treatment sites confirmed Yes.  .  Details of Procedure: Skin was cleaned with alcohol.   Prior to injection, the needle plunger was aspirated to make sure the needle was not within a blood vessel.  There was no blood retrieved on aspiration.    Following is a summary of the muscles injected  And the amount of Botulinum toxin used:  Injections  Location Left  Right Units Number of sites        Corrugator      Frontalis      Lower Lid, Lateral 2.5 2.5 5.0 1 each side  Lower Lid Medial 2.5  2.5 1  Upper Lid, Lateral 2.5 2.5 5.0 1 each side  Upper Lid, Medial 2.5  2.5 1  Canthus 5.0 2.5 7.5 1 each side  Temporalis      Masseter      Procerus      Zygomaticus Major      TOTAL UNITS:   22.5    Agent: Botulinum Type A ( Onobotulinum Toxin type A ).  1 vials of Botox were used, each containing 50 units and freshly diluted with 2 mL of sterile, non-perserved saline   Total injected (Units): 22.5  Total wasted (Units): 5 Pt tolerated procedure well; no complications Reinjection is anticipated in 3 months.

## 2018-05-20 ENCOUNTER — Other Ambulatory Visit: Payer: Self-pay | Admitting: Physical Medicine and Rehabilitation

## 2018-05-20 DIAGNOSIS — M5136 Other intervertebral disc degeneration, lumbar region: Secondary | ICD-10-CM

## 2018-06-24 ENCOUNTER — Ambulatory Visit
Admission: RE | Admit: 2018-06-24 | Discharge: 2018-06-24 | Disposition: A | Discharge status: Home / Self Care | Payer: Medicare Other | Source: Ambulatory Visit | Attending: Physical Medicine and Rehabilitation | Admitting: Physical Medicine and Rehabilitation

## 2018-06-24 DIAGNOSIS — M5136 Other intervertebral disc degeneration, lumbar region: Secondary | ICD-10-CM

## 2018-08-16 ENCOUNTER — Ambulatory Visit (INDEPENDENT_AMBULATORY_CARE_PROVIDER_SITE_OTHER): Payer: Medicare Other | Admitting: Neurology

## 2018-08-16 DIAGNOSIS — G5139 Clonic hemifacial spasm, unspecified: Secondary | ICD-10-CM | POA: Diagnosis not present

## 2018-08-16 DIAGNOSIS — G245 Blepharospasm: Secondary | ICD-10-CM | POA: Diagnosis not present

## 2018-08-16 MED ORDER — ONABOTULINUMTOXINA 100 UNITS IJ SOLR
23.0000 [IU] | Freq: Once | INTRAMUSCULAR | Status: AC
  Administered 2018-08-16: 23 [IU] via INTRAMUSCULAR

## 2018-08-16 NOTE — Procedures (Signed)
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  Noting some twitching of lower face on the left just prior to injections Adverse Effects: n/a  Consent obtained from: The patient Benefits discussed included, but were not limited to decreased muscle tightness, increased joint range of motion, and decreased pain.  Risk discussed included, but were not limited pain and discomfort, bleeding, increased ptosis, increased facial asymmetry, bruising, excessive weakness, venous thrombosis, muscle atrophy and dysphagia.  A copy of the patient medication guide was given to the patient which explains the blackbox warning.  Patients identity and treatment sites confirmed Yes.  .  Details of Procedure: Skin was cleaned with alcohol.   Prior to injection, the needle plunger was aspirated to make sure the needle was not within a blood vessel.  There was no blood retrieved on aspiration.    Following is a summary of the muscles injected  And the amount of Botulinum toxin used:  Injections  Location Left  Right Units Number of sites        Corrugator      Frontalis      Lower Lid, Lateral 2.5 2.5 5.0 1 each side  Lower Lid Medial 2.5  2.5 1  Upper Lid, Lateral 2.5 2.5 5.0 1 each side  Upper Lid, Medial 2.5  2.5 1  Canthus 5.0 2.5 7.5 1 each side  Temporalis      Masseter      Procerus      Zygomaticus Major      TOTAL UNITS:   22.5    Agent: Botulinum Type A ( Onobotulinum Toxin type A ).  1 vials of Botox were used, each containing 50 units and freshly diluted with 2 mL of sterile, non-perserved saline   Total injected (Units): 22.5  Total wasted (Units): 0 Pt tolerated procedure well; no complications Reinjection is anticipated in 3 months.

## 2018-11-29 ENCOUNTER — Ambulatory Visit (INDEPENDENT_AMBULATORY_CARE_PROVIDER_SITE_OTHER): Payer: Medicare Other | Admitting: Neurology

## 2018-11-29 DIAGNOSIS — G5139 Clonic hemifacial spasm, unspecified: Secondary | ICD-10-CM

## 2018-11-29 DIAGNOSIS — G245 Blepharospasm: Secondary | ICD-10-CM

## 2018-11-29 MED ORDER — ONABOTULINUMTOXINA 100 UNITS IJ SOLR
23.0000 [IU] | Freq: Once | INTRAMUSCULAR | Status: AC
  Administered 2018-11-29: 23 [IU] via INTRAMUSCULAR

## 2018-11-29 NOTE — Procedures (Signed)
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  Noting some twitching of lower face on the left just prior to injections Adverse Effects: n/a  Consent obtained from: The patient Benefits discussed included, but were not limited to decreased muscle tightness, increased joint range of motion, and decreased pain.  Risk discussed included, but were not limited pain and discomfort, bleeding, increased ptosis, increased facial asymmetry, bruising, excessive weakness, venous thrombosis, muscle atrophy and dysphagia.  A copy of the patient medication guide was given to the patient which explains the blackbox warning.  Patients identity and treatment sites confirmed Yes.  .  Details of Procedure: Skin was cleaned with alcohol.   Prior to injection, the needle plunger was aspirated to make sure the needle was not within a blood vessel.  There was no blood retrieved on aspiration.    Following is a summary of the muscles injected  And the amount of Botulinum toxin used:  Injections  Location Left  Right Units Number of sites        Corrugator      Frontalis      Lower Lid, Lateral 2.5 2.5 5.0 1 each side  Lower Lid Medial 2.5  2.5 1  Upper Lid, Lateral 2.5 2.5 5.0 1 each side  Upper Lid, Medial 2.5  2.5 1  Canthus 5.0 2.5 7.5 1 each side  Temporalis      Masseter      Procerus      Zygomaticus Major      TOTAL UNITS:   22.5    Agent: Botulinum Type A ( Onobotulinum Toxin type A ).  1 vials of Botox were used, each containing 50 units and freshly diluted with 2 mL of sterile, non-perserved saline   Total injected (Units): 22.5  Total wasted (Units): 0 Pt tolerated procedure well; no complications Reinjection is anticipated in 3 months.

## 2018-12-27 ENCOUNTER — Encounter: Payer: Self-pay | Admitting: Neurology

## 2018-12-27 NOTE — Progress Notes (Signed)
Botox approval valid through 01/20/2019. Patient to use buy and bill.

## 2019-01-08 IMAGING — CT CT CERVICAL SPINE W/O CM
3 of 11 series · 8 of 33 positions shown, 9 images · non-contrast
Comparison: None.

CLINICAL DATA: 65-year-old female with fall.

EXAM:
CT HEAD WITHOUT CONTRAST
CT MAXILLOFACIAL WITHOUT CONTRAST
CT CERVICAL SPINE WITHOUT CONTRAST
TECHNIQUE: Multidetector CT imaging of the head, cervical spine, and
maxillofacial structures were performed using the standard protocol
without intravenous contrast. Multiplanar CT image reconstructions
of the cervical spine and maxillofacial structures were also
generated.

[Series 10: sagittal st · sagittal · 0.29mm/px · 4 of 76 slices shown]
[im 16/76  bone]
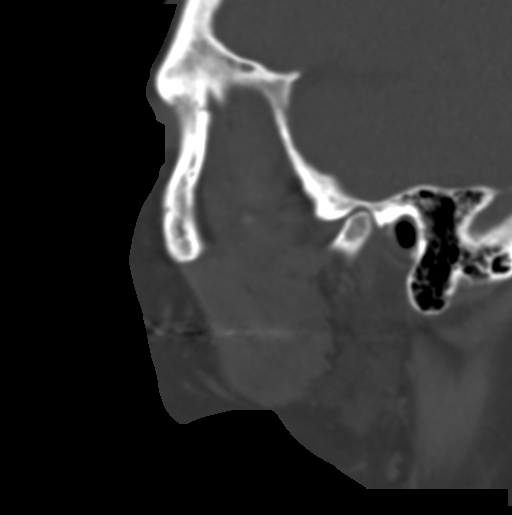
[im 31/76  bone]
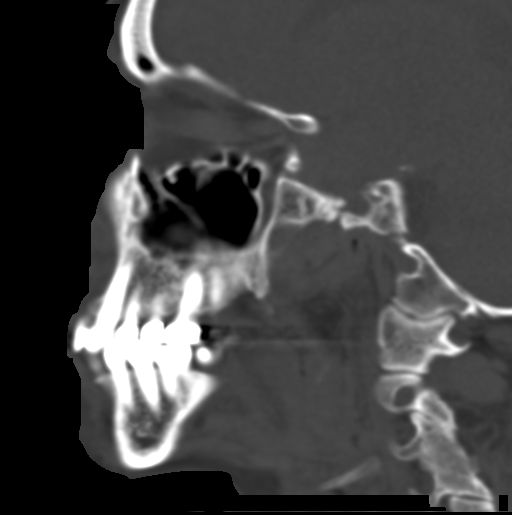
[im 46/76  bone]
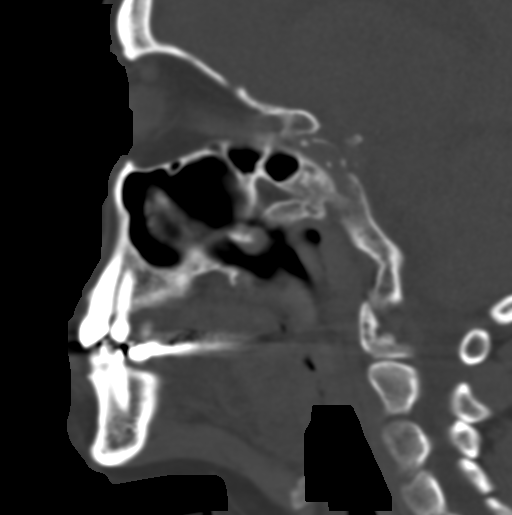
[im 61/76  bone]
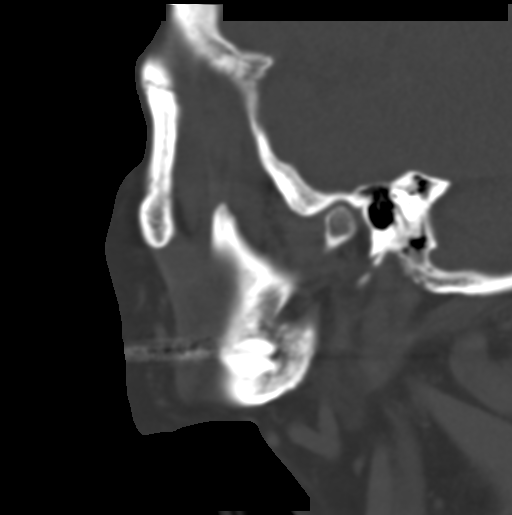

[Series 13: c-spine st · axial · 0.31mm/px · z∈[+1286,+1348]mm · 2 of 93 slices shown, 3 images]
[im 31/93  soft-tissue]
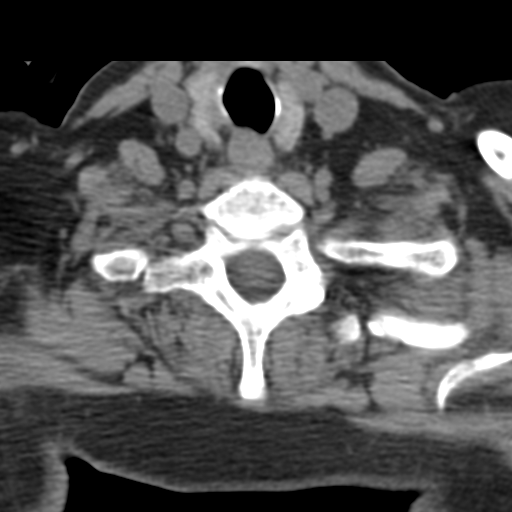
[im 31/93  bone]
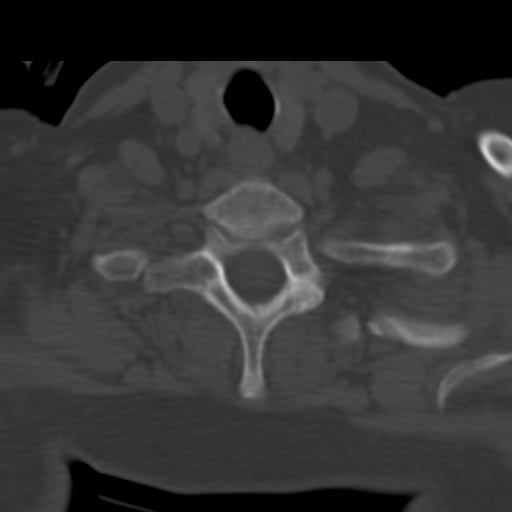
[im 62/93  bone]
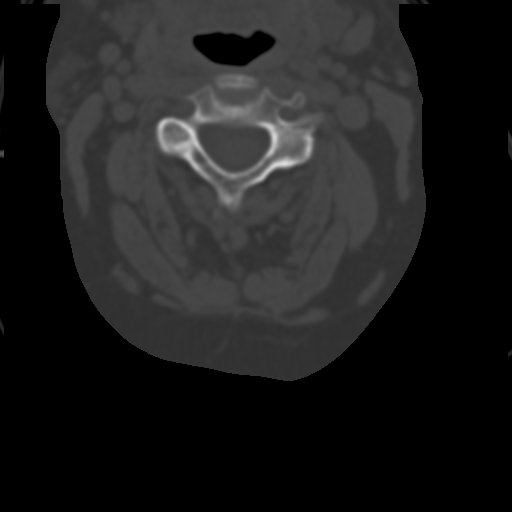

[Series 17: axial · axial · 0.19mm/px · z∈[+1270,+1331]mm · 2 of 100 slices shown]
[im 34/100  bone]
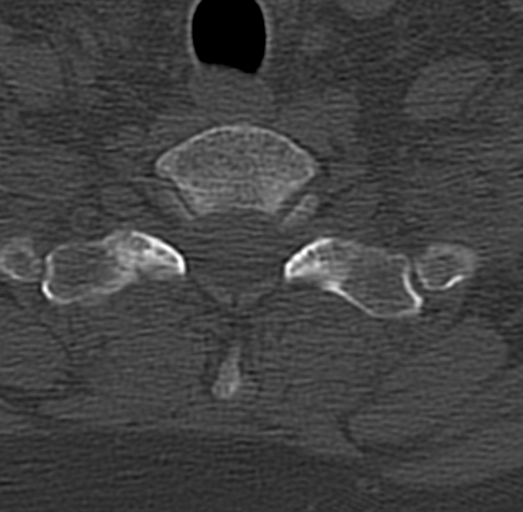
[im 67/100  bone]
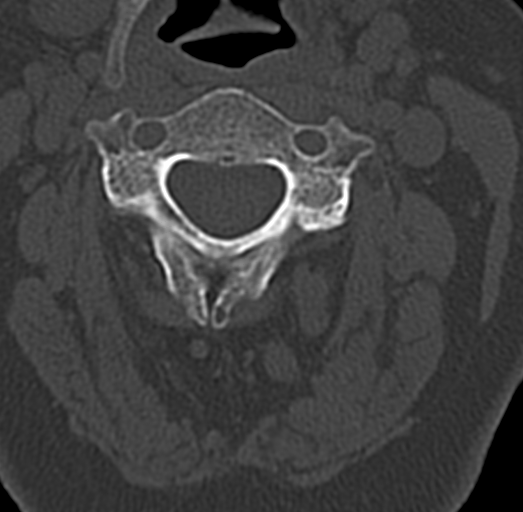

[8 of 33 positions shown; findings below may reference images not displayed]

FINDINGS: CT HEAD FINDINGS

Brain: There is small subarachnoid hemorrhage in the left temporal
lobe and left sylvian fissure. Small amount of subarachnoid
hemorrhage is also noted in the anterior right frontal lobe. No
other acute intracranial hemorrhage identified. The ventricles and
sulci appropriate in size for patient's age. The gray-white matter
discrimination is preserved. There is no mass effect or midline
shift.

Vascular: No hyperdense vessel or unexpected calcification.

Skull: Normal. Negative for fracture or focal lesion.

Other: None

CT MAXILLOFACIAL FINDINGS

Osseous: No acute fracture.

Orbits: Negative. No traumatic or inflammatory finding.

Sinuses: Clear.

Soft tissues: Left periorbital contusion.

CT CERVICAL SPINE FINDINGS

Alignment: No acute subluxation.

Skull base and vertebrae: No acute fracture. No primary bone lesion
or focal pathologic process.

Soft tissues and spinal canal: No prevertebral fluid or swelling. No
visible canal hematoma.

Disc levels:  Degenerative changes primarily at C5-C6.

Upper chest: Mild emphysematous changes.

Other: None
IMPRESSION: 1. Small bilateral subarachnoid hemorrhages primarily involving the
left sylvian fissure and right frontal lobe. No mass effect or
midline shift.
2. No acute/traumatic cervical spine pathology.
3. No acute facial bone fractures.
These results were called by telephone at the time of interpretation
on 04/17/2017 at [DATE] to Dr. ARNALDO MANUEL TUMBA , who verbally
acknowledged these results.

## 2019-02-21 ENCOUNTER — Encounter: Payer: Self-pay | Admitting: *Deleted

## 2019-02-21 NOTE — Progress Notes (Signed)
This was entered due the letter stating it was good through 01/20/2019 [place] Letters Key: [id number] - [place] Case ID: [id number] Need help? Call us at [phone]  Outcome  Additional Information Required  This medication or product was previously approved on [id number] from 2018-10-21 to 2019-10-02. **Please note: Formulary lowering, tiering exception, cost reduction and/or pre-benefit determination review (including prospective Medicare hospice reviews) requests cannot be requested using this method of submission. Please contact us at [phone] instead.  DrugBotox 100UNIT solution  FormOptumRx Medicare Part D Electronic Prior Authorization Form [phone] NCP[place])

## 2019-03-07 ENCOUNTER — Ambulatory Visit (INDEPENDENT_AMBULATORY_CARE_PROVIDER_SITE_OTHER): Payer: Medicare Other | Admitting: Neurology

## 2019-03-07 ENCOUNTER — Other Ambulatory Visit: Payer: Self-pay

## 2019-03-07 DIAGNOSIS — G5139 Clonic hemifacial spasm, unspecified: Secondary | ICD-10-CM

## 2019-03-07 DIAGNOSIS — G245 Blepharospasm: Secondary | ICD-10-CM

## 2019-03-07 MED ORDER — ONABOTULINUMTOXINA 100 UNITS IJ SOLR
22.0000 [IU] | Freq: Once | INTRAMUSCULAR | Status: AC
  Administered 2019-03-07: 22 [IU] via INTRAMUSCULAR

## 2019-03-07 NOTE — Procedures (Signed)
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  Noting some twitching of lower face on the left just prior to injections Adverse Effects: n/a  Consent obtained from: The patient Benefits discussed included, but were not limited to decreased muscle tightness, increased joint range of motion, and decreased pain.  Risk discussed included, but were not limited pain and discomfort, bleeding, increased ptosis, increased facial asymmetry, bruising, excessive weakness, venous thrombosis, muscle atrophy and dysphagia.  A copy of the patient medication guide was given to the patient which explains the blackbox warning.  Patients identity and treatment sites confirmed Yes.  .  Details of Procedure: Skin was cleaned with alcohol.   Prior to injection, the needle plunger was aspirated to make sure the needle was not within a blood vessel.  There was no blood retrieved on aspiration.    Following is a summary of the muscles injected  And the amount of Botulinum toxin used:  Injections  Location Left  Right Units Number of sites        Corrugator      Frontalis      Lower Lid, Lateral 2.5 2.5 5.0 1 each side  Lower Lid Medial 2.5  2.5 1  Upper Lid, Lateral 2.5 2.5 5.0 1 each side  Upper Lid, Medial 2.5  2.5 1  Canthus 5.0 2.5 7.5 1 each side  Temporalis      Masseter      Procerus      Zygomaticus Major      TOTAL UNITS:   22.5    Agent: Botulinum Type A ( Onobotulinum Toxin type A ).  1 vials of Botox were used, each containing 50 units and freshly diluted with 2 mL of sterile, non-perserved saline   Total injected (Units): 22.5  Total wasted (Units): 15 Pt tolerated procedure well; no complications Reinjection is anticipated in 3 months.

## 2019-06-27 ENCOUNTER — Other Ambulatory Visit: Payer: Self-pay

## 2019-06-27 ENCOUNTER — Ambulatory Visit (INDEPENDENT_AMBULATORY_CARE_PROVIDER_SITE_OTHER): Payer: Medicare Other | Admitting: Neurology

## 2019-06-27 DIAGNOSIS — G245 Blepharospasm: Secondary | ICD-10-CM

## 2019-06-27 DIAGNOSIS — G5139 Clonic hemifacial spasm, unspecified: Secondary | ICD-10-CM

## 2019-06-27 MED ORDER — ONABOTULINUMTOXINA 100 UNITS IJ SOLR
25.0000 [IU] | Freq: Once | INTRAMUSCULAR | Status: AC
  Administered 2019-06-27: 25 [IU] via INTRAMUSCULAR

## 2019-06-27 NOTE — Procedures (Signed)
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 Adverse Effects: n/a  Consent obtained from: The patient Benefits discussed included, but were not limited to decreased muscle tightness, increased joint range of motion, and decreased pain.  Risk discussed included, but were not limited pain and discomfort, bleeding, increased ptosis, increased facial asymmetry, bruising, excessive weakness, venous thrombosis, muscle atrophy and dysphagia.  A copy of the patient medication guide was given to the patient which explains the blackbox warning.  Patients identity and treatment sites confirmed Yes.  .  Details of Procedure: Skin was cleaned with alcohol.   Prior to injection, the needle plunger was aspirated to make sure the needle was not within a blood vessel.  There was no blood retrieved on aspiration.    Following is a summary of the muscles injected  And the amount of Botulinum toxin used:  Injections  Location Left  Right Units Number of sites        Corrugator      Frontalis      Lower Lid, Lateral 2.5 2.5 5.0 1 each side  Lower Lid Medial 2.5  2.5 1  Upper Lid, Lateral 2.5 2.5 5.0 1 each side  Upper Lid, Medial 2.5  2.5 1  Canthus 5.0 2.5 7.5 1 each side  Temporalis      Masseter      Procerus      Zygomaticus Major      TOTAL UNITS:   22.5    Agent: Botulinum Type A ( Onobotulinum Toxin type A ).  1 vials of Botox were used, each containing 50 units and freshly diluted with 2 mL of sterile, non-perserved saline   Total injected (Units): 22.5  Total wasted (Units): 0 Pt tolerated procedure well; no complications Reinjection is anticipated in 3 months.

## 2019-10-02 ENCOUNTER — Ambulatory Visit (INDEPENDENT_AMBULATORY_CARE_PROVIDER_SITE_OTHER): Payer: Medicare Other | Admitting: Neurology

## 2019-10-02 ENCOUNTER — Other Ambulatory Visit: Payer: Self-pay

## 2019-10-02 DIAGNOSIS — G245 Blepharospasm: Secondary | ICD-10-CM | POA: Diagnosis not present

## 2019-10-02 MED ORDER — ONABOTULINUMTOXINA 100 UNITS IJ SOLR
25.0000 [IU] | Freq: Once | INTRAMUSCULAR | Status: AC
  Administered 2019-10-02: 25 [IU] via INTRAMUSCULAR

## 2019-10-02 NOTE — Procedures (Signed)
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 Adverse Effects: n/a  Consent obtained from: The patient Benefits discussed included, but were not limited to decreased muscle tightness, increased joint range of motion, and decreased pain.  Risk discussed included, but were not limited pain and discomfort, bleeding, increased ptosis, increased facial asymmetry, bruising, excessive weakness, venous thrombosis, muscle atrophy and dysphagia.  A copy of the patient medication guide was given to the patient which explains the blackbox warning.  Patients identity and treatment sites confirmed Yes.  .  Details of Procedure: Skin was cleaned with alcohol.   Prior to injection, the needle plunger was aspirated to make sure the needle was not within a blood vessel.  There was no blood retrieved on aspiration.    Following is a summary of the muscles injected  And the amount of Botulinum toxin used:  Injections  Location Left  Right Units Number of sites        Corrugator      Frontalis      Lower Lid, Lateral 2.5 2.5 5.0 1 each side  Lower Lid Medial 2.5  2.5 1  Upper Lid, Lateral 2.5 2.5 5.0 1 each side  Upper Lid, Medial 2.5  2.5 1  Canthus 5.0 2.5 7.5 1 each side  Temporalis      Masseter      Procerus      Zygomaticus Major      TOTAL UNITS:   22.5    Agent: Botulinum Type A ( Onobotulinum Toxin type A ).  1 vials of Botox were used, each containing 50 units and freshly diluted with 2 mL of sterile, non-perserved saline   Total injected (Units): 25  Total wasted (Units): 0 Pt tolerated procedure well; no complications Reinjection is anticipated in 3 months.

## 2019-10-10 ENCOUNTER — Ambulatory Visit: Payer: Medicare Other | Admitting: Neurology

## 2019-11-17 ENCOUNTER — Other Ambulatory Visit: Payer: Self-pay | Admitting: Dermatology

## 2019-12-24 ENCOUNTER — Encounter: Payer: Self-pay | Admitting: *Deleted

## 2019-12-24 NOTE — Progress Notes (Addendum)
[place] Letters [id number] - PA Case ID: [id number] Need help? Call us at [phone] Outcome  Approvedon March 24 PA Case: [id number],  Status: Approved,  Coverage Starts on: 12/24/2019 12:00:00 AM,  Coverage Ends on: 10/01/2020 12:00:00 AM.  Questions? Contact [phone]. Drug  Botox 100UNIT solution Form  Nurse, adult and Medical Benefit PA Form

## 2020-01-01 ENCOUNTER — Ambulatory Visit (INDEPENDENT_AMBULATORY_CARE_PROVIDER_SITE_OTHER): Payer: Medicare PPO | Admitting: Neurology

## 2020-01-01 ENCOUNTER — Other Ambulatory Visit: Payer: Self-pay

## 2020-01-01 DIAGNOSIS — G5133 Clonic hemifacial spasm, bilateral: Secondary | ICD-10-CM

## 2020-01-01 DIAGNOSIS — G245 Blepharospasm: Secondary | ICD-10-CM

## 2020-01-01 MED ORDER — ONABOTULINUMTOXINA 100 UNITS IJ SOLR
100.0000 [IU] | Freq: Once | INTRAMUSCULAR | Status: AC
  Administered 2020-01-01: 25 [IU] via INTRAMUSCULAR

## 2020-01-01 NOTE — Procedures (Signed)
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  Risk discussed included, but were not limited pain and discomfort, bleeding, increased ptosis, increased facial asymmetry, bruising, excessive weakness, venous thrombosis, muscle atrophy and dysphagia.  A copy of the patient medication guide was given to the patient which explains the blackbox warning.  Patients identity and treatment sites confirmed Yes.  .  Details of Procedure: Skin was cleaned with alcohol.   Prior to injection, the needle plunger was aspirated to make sure the needle was not within a blood vessel.  There was no blood retrieved on aspiration.    Following is a summary of the muscles injected  And the amount of Botulinum toxin used:  Injections  Location Left  Right Units Number of sites        Corrugator      Frontalis      Lower Lid, Lateral 2.5 2.5 5.0 1 each side  Lower Lid Medial 2.5  2.5 1  Upper Lid, Lateral 2.5 2.5 5.0 1 each side  Upper Lid, Medial 2.5  2.5 1  Canthus 5.0 2.5 7.5 1 each side  Temporalis      Masseter      Procerus      Zygomaticus Major      TOTAL UNITS:   22.5    Agent: Botulinum Type A ( Onobotulinum Toxin type A ).  1 vials of Botox were used, each containing 50 units and freshly diluted with 2 mL of sterile, non-perserved saline   Total injected (Units): 25  Total wasted (Units): 0 Pt tolerated procedure well; no complications Reinjection is anticipated in 3 months.  

## 2020-01-06 ENCOUNTER — Other Ambulatory Visit (HOSPITAL_COMMUNITY): Payer: Self-pay | Admitting: Orthopedic Surgery

## 2020-01-13 ENCOUNTER — Other Ambulatory Visit: Payer: Self-pay

## 2020-01-13 ENCOUNTER — Encounter (HOSPITAL_BASED_OUTPATIENT_CLINIC_OR_DEPARTMENT_OTHER): Payer: Self-pay | Admitting: Orthopedic Surgery

## 2020-01-19 ENCOUNTER — Other Ambulatory Visit (HOSPITAL_COMMUNITY)
Admission: RE | Admit: 2020-01-19 | Discharge: 2020-01-19 | Disposition: A | Discharge status: Home / Self Care | Payer: Medicare PPO | Source: Ambulatory Visit | Attending: Orthopedic Surgery | Admitting: Orthopedic Surgery

## 2020-01-19 ENCOUNTER — Encounter (HOSPITAL_BASED_OUTPATIENT_CLINIC_OR_DEPARTMENT_OTHER)
Admission: RE | Admit: 2020-01-19 | Discharge: 2020-01-19 | Disposition: A | Discharge status: Home / Self Care | Payer: Medicare PPO | Source: Ambulatory Visit | Attending: Orthopedic Surgery | Admitting: Orthopedic Surgery

## 2020-01-19 DIAGNOSIS — Z01812 Encounter for preprocedural laboratory examination: Secondary | ICD-10-CM | POA: Insufficient documentation

## 2020-01-19 DIAGNOSIS — Z20822 Contact with and (suspected) exposure to covid-19: Secondary | ICD-10-CM | POA: Insufficient documentation

## 2020-01-19 LAB — SARS CORONAVIRUS 2 (TAT 6-24 HRS): SARS Coronavirus 2: NEGATIVE

## 2020-01-19 NOTE — Progress Notes (Signed)

## 2020-01-22 ENCOUNTER — Encounter (HOSPITAL_BASED_OUTPATIENT_CLINIC_OR_DEPARTMENT_OTHER)
Admission: RE | Disposition: A | Discharge status: Home / Self Care | Payer: Self-pay | Source: Home / Self Care | Attending: Orthopedic Surgery

## 2020-01-22 ENCOUNTER — Ambulatory Visit (HOSPITAL_BASED_OUTPATIENT_CLINIC_OR_DEPARTMENT_OTHER)
Admission: RE | Admit: 2020-01-22 | Discharge: 2020-01-22 | Disposition: A | Discharge status: Home / Self Care | Payer: Medicare PPO | Attending: Orthopedic Surgery | Admitting: Orthopedic Surgery

## 2020-01-22 ENCOUNTER — Other Ambulatory Visit: Payer: Self-pay

## 2020-01-22 ENCOUNTER — Encounter (HOSPITAL_BASED_OUTPATIENT_CLINIC_OR_DEPARTMENT_OTHER): Payer: Self-pay | Admitting: Orthopedic Surgery

## 2020-01-22 ENCOUNTER — Ambulatory Visit (HOSPITAL_BASED_OUTPATIENT_CLINIC_OR_DEPARTMENT_OTHER): Payer: Medicare PPO | Admitting: Certified Registered"

## 2020-01-22 DIAGNOSIS — F419 Anxiety disorder, unspecified: Secondary | ICD-10-CM | POA: Diagnosis not present

## 2020-01-22 DIAGNOSIS — M19072 Primary osteoarthritis, left ankle and foot: Secondary | ICD-10-CM | POA: Diagnosis present

## 2020-01-22 DIAGNOSIS — I10 Essential (primary) hypertension: Secondary | ICD-10-CM | POA: Diagnosis not present

## 2020-01-22 DIAGNOSIS — Z880 Allergy status to penicillin: Secondary | ICD-10-CM | POA: Insufficient documentation

## 2020-01-22 DIAGNOSIS — Z885 Allergy status to narcotic agent status: Secondary | ICD-10-CM | POA: Diagnosis not present

## 2020-01-22 DIAGNOSIS — Z882 Allergy status to sulfonamides status: Secondary | ICD-10-CM | POA: Diagnosis not present

## 2020-01-22 DIAGNOSIS — J45909 Unspecified asthma, uncomplicated: Secondary | ICD-10-CM | POA: Insufficient documentation

## 2020-01-22 DIAGNOSIS — Z9884 Bariatric surgery status: Secondary | ICD-10-CM | POA: Diagnosis not present

## 2020-01-22 DIAGNOSIS — Z791 Long term (current) use of non-steroidal anti-inflammatories (NSAID): Secondary | ICD-10-CM | POA: Insufficient documentation

## 2020-01-22 DIAGNOSIS — Z79899 Other long term (current) drug therapy: Secondary | ICD-10-CM | POA: Insufficient documentation

## 2020-01-22 HISTORY — PX: FOOT ARTHRODESIS: SHX1655

## 2020-01-22 SURGERY — FUSION, JOINT, FOOT
Anesthesia: Regional | Site: Foot | Laterality: Left

## 2020-01-22 MED ORDER — FENTANYL CITRATE (PF) 100 MCG/2ML IJ SOLN
25.0000 ug | INTRAMUSCULAR | Status: DC | PRN
  Administered 2020-01-22 (×2): 50 ug via INTRAVENOUS

## 2020-01-22 MED ORDER — ONDANSETRON HCL 4 MG/2ML IJ SOLN
INTRAMUSCULAR | Status: DC | PRN
  Administered 2020-01-22: 4 mg via INTRAVENOUS

## 2020-01-22 MED ORDER — FENTANYL CITRATE (PF) 100 MCG/2ML IJ SOLN
INTRAMUSCULAR | Status: AC
  Filled 2020-01-22: qty 2

## 2020-01-22 MED ORDER — VANCOMYCIN HCL 500 MG IV SOLR
INTRAVENOUS | Status: DC | PRN
  Administered 2020-01-22: 500 mg via TOPICAL

## 2020-01-22 MED ORDER — MIDAZOLAM HCL 2 MG/2ML IJ SOLN
1.0000 mg | INTRAMUSCULAR | Status: DC | PRN
  Administered 2020-01-22: 1 mg via INTRAVENOUS

## 2020-01-22 MED ORDER — SODIUM CHLORIDE 0.9 % IV SOLN: INTRAVENOUS | Status: DC

## 2020-01-22 MED ORDER — FENTANYL CITRATE (PF) 100 MCG/2ML IJ SOLN
50.0000 ug | INTRAMUSCULAR | Status: DC | PRN
  Administered 2020-01-22: 50 ug via INTRAVENOUS

## 2020-01-22 MED ORDER — LIDOCAINE HCL (CARDIAC) PF 100 MG/5ML IV SOSY
PREFILLED_SYRINGE | INTRAVENOUS | Status: DC | PRN
  Administered 2020-01-22: 30 mg via INTRAVENOUS

## 2020-01-22 MED ORDER — ACETAMINOPHEN 500 MG PO TABS
1000.0000 mg | ORAL_TABLET | Freq: Once | ORAL | Status: AC
  Administered 2020-01-22: 1000 mg via ORAL

## 2020-01-22 MED ORDER — PROPOFOL 10 MG/ML IV BOLUS
INTRAVENOUS | Status: DC | PRN
  Administered 2020-01-22: 150 mg via INTRAVENOUS

## 2020-01-22 MED ORDER — DEXAMETHASONE SODIUM PHOSPHATE 10 MG/ML IJ SOLN
INTRAMUSCULAR | Status: DC | PRN
  Administered 2020-01-22: 4 mg via INTRAVENOUS

## 2020-01-22 MED ORDER — CEFAZOLIN SODIUM-DEXTROSE 2-4 GM/100ML-% IV SOLN
2.0000 g | INTRAVENOUS | Status: AC
  Administered 2020-01-22: 2 g via INTRAVENOUS

## 2020-01-22 MED ORDER — HYDROCODONE-ACETAMINOPHEN 5-325 MG PO TABS: 1.0000 | ORAL_TABLET | Freq: Four times a day (QID) | ORAL | 0 refills | Status: AC | PRN

## 2020-01-22 MED ORDER — LACTATED RINGERS IV SOLN: INTRAVENOUS | Status: DC

## 2020-01-22 MED ORDER — CEFAZOLIN SODIUM-DEXTROSE 2-4 GM/100ML-% IV SOLN
INTRAVENOUS | Status: AC
  Filled 2020-01-22: qty 100

## 2020-01-22 MED ORDER — PROPOFOL 500 MG/50ML IV EMUL
INTRAVENOUS | Status: DC | PRN
  Administered 2020-01-22: 25 ug/kg/min via INTRAVENOUS

## 2020-01-22 MED ORDER — ACETAMINOPHEN 500 MG PO TABS
ORAL_TABLET | ORAL | Status: AC
  Filled 2020-01-22: qty 2

## 2020-01-22 MED ORDER — MIDAZOLAM HCL 2 MG/2ML IJ SOLN
INTRAMUSCULAR | Status: AC
  Filled 2020-01-22: qty 2

## 2020-01-22 MED ORDER — ROPIVACAINE HCL 5 MG/ML IJ SOLN
INTRAMUSCULAR | Status: DC | PRN
  Administered 2020-01-22: 30 mL via PERINEURAL

## 2020-01-22 MED ORDER — 0.9 % SODIUM CHLORIDE (POUR BTL) OPTIME
TOPICAL | Status: DC | PRN
  Administered 2020-01-22: 200 mL

## 2020-01-22 MED ORDER — DEXAMETHASONE SODIUM PHOSPHATE 10 MG/ML IJ SOLN
INTRAMUSCULAR | Status: DC | PRN
  Administered 2020-01-22: 5 mg

## 2020-01-22 SURGICAL SUPPLY — 76 items
APL PRP STRL LF DISP 70% ISPRP (MISCELLANEOUS) ×1
BANDAGE ESMARK 6X9 LF (GAUZE/BANDAGES/DRESSINGS) IMPLANT
BIT DRILL CANN 3.5MM (DRILL) IMPLANT
BIT TREPHINE CORING 8 (BIT) IMPLANT
BLADE AVERAGE 25X9 (BLADE) IMPLANT
BLADE MICRO SAGITTAL (BLADE) IMPLANT
BLADE SURG 15 STRL LF DISP TIS (BLADE) ×2 IMPLANT
BLADE SURG 15 STRL SS (BLADE) ×4
BNDG CMPR 9X6 STRL LF SNTH (GAUZE/BANDAGES/DRESSINGS)
BNDG COHESIVE 4X5 TAN STRL (GAUZE/BANDAGES/DRESSINGS) ×2 IMPLANT
BNDG COHESIVE 6X5 TAN STRL LF (GAUZE/BANDAGES/DRESSINGS) ×2 IMPLANT
BNDG ESMARK 6X9 LF (GAUZE/BANDAGES/DRESSINGS)
CHLORAPREP W/TINT 26 (MISCELLANEOUS) ×2 IMPLANT
COVER BACK TABLE 60X90IN (DRAPES) ×2 IMPLANT
COVER MAYO STAND STRL (DRAPES) ×1 IMPLANT
COVER WAND RF STERILE (DRAPES) IMPLANT
CUFF TOURN SGL QUICK 34 (TOURNIQUET CUFF) ×2
CUFF TRNQT CYL 34X4.125X (TOURNIQUET CUFF) IMPLANT
DRAPE EXTREMITY T 121X128X90 (DISPOSABLE) ×2 IMPLANT
DRAPE OEC MINIVIEW 54X84 (DRAPES) ×2 IMPLANT
DRAPE U-SHAPE 47X51 STRL (DRAPES) ×2 IMPLANT
DRILL CANN 3.5MM (DRILL) ×2
DRSG MEPITEL 4X7.2 (GAUZE/BANDAGES/DRESSINGS) ×2 IMPLANT
DRSG PAD ABDOMINAL 8X10 ST (GAUZE/BANDAGES/DRESSINGS) ×4 IMPLANT
ELECT REM PT RETURN 9FT ADLT (ELECTROSURGICAL) ×2
ELECTRODE REM PT RTRN 9FT ADLT (ELECTROSURGICAL) ×1 IMPLANT
GAUZE SPONGE 4X4 12PLY STRL (GAUZE/BANDAGES/DRESSINGS) ×2 IMPLANT
GLOVE BIO SURGEON STRL SZ8 (GLOVE) ×2 IMPLANT
GLOVE BIOGEL PI IND STRL 6.5 (GLOVE) IMPLANT
GLOVE BIOGEL PI IND STRL 7.0 (GLOVE) IMPLANT
GLOVE BIOGEL PI IND STRL 8 (GLOVE) ×2 IMPLANT
GLOVE BIOGEL PI INDICATOR 6.5 (GLOVE) ×3
GLOVE BIOGEL PI INDICATOR 7.0 (GLOVE) ×1
GLOVE BIOGEL PI INDICATOR 8 (GLOVE) ×2
GLOVE ECLIPSE 6.5 STRL STRAW (GLOVE) ×3 IMPLANT
GLOVE ECLIPSE 7.0 STRL STRAW (GLOVE) ×1 IMPLANT
GLOVE ECLIPSE 8.0 STRL XLNG CF (GLOVE) ×2 IMPLANT
GOWN STRL REUS W/ TWL LRG LVL3 (GOWN DISPOSABLE) ×1 IMPLANT
GOWN STRL REUS W/ TWL XL LVL3 (GOWN DISPOSABLE) ×2 IMPLANT
GOWN STRL REUS W/TWL LRG LVL3 (GOWN DISPOSABLE) ×6
GOWN STRL REUS W/TWL XL LVL3 (GOWN DISPOSABLE) ×4
K-WIRE 1.8 (WIRE) ×2
K-WIRE FX200X1.8XTROC TIP (WIRE) ×1
KIT ARCUS SIZING TEMPLATE STRL (KITS) IMPLANT
KIT STAPLE ARCUS 16X13 STRL (Staple) ×1 IMPLANT
KWIRE FX200X1.8XTROC TIP (WIRE) IMPLANT
LOOP VESSEL MINI RED (MISCELLANEOUS) ×1 IMPLANT
NDL SAFETY ECLIPSE 18X1.5 (NEEDLE) IMPLANT
NEEDLE HYPO 18GX1.5 SHARP (NEEDLE)
NEEDLE HYPO 22GX1.5 SAFETY (NEEDLE) ×2 IMPLANT
PAD CAST 4YDX4 CTTN HI CHSV (CAST SUPPLIES) ×1 IMPLANT
PADDING CAST COTTON 4X4 STRL (CAST SUPPLIES) ×2
PADDING CAST COTTON 6X4 STRL (CAST SUPPLIES) ×2 IMPLANT
PENCIL SMOKE EVACUATOR (MISCELLANEOUS) ×2 IMPLANT
SANITIZER HAND PURELL 535ML FO (MISCELLANEOUS) ×2 IMPLANT
SCREW CANN 5.0X40MM (Screw) ×1 IMPLANT
SET BASIN DAY SURGERY F.S. (CUSTOM PROCEDURE TRAY) ×2 IMPLANT
SHEET MEDIUM DRAPE 40X70 STRL (DRAPES) ×2 IMPLANT
SLEEVE SCD COMPRESS KNEE MED (MISCELLANEOUS) ×2 IMPLANT
SPLINT FAST PLASTER 5X30 (CAST SUPPLIES) ×20
SPLINT PLASTER CAST FAST 5X30 (CAST SUPPLIES) ×20 IMPLANT
SPONGE LAP 18X18 RF (DISPOSABLE) ×2 IMPLANT
SPONGE SURGIFOAM ABS GEL 12-7 (HEMOSTASIS) IMPLANT
STOCKINETTE 6  STRL (DRAPES) ×2
STOCKINETTE 6 STRL (DRAPES) ×1 IMPLANT
SUCTION FRAZIER HANDLE 10FR (MISCELLANEOUS) ×2
SUCTION TUBE FRAZIER 10FR DISP (MISCELLANEOUS) ×1 IMPLANT
SUT ETHILON 3 0 PS 1 (SUTURE) ×2 IMPLANT
SUT MNCRL AB 3-0 PS2 18 (SUTURE) ×2 IMPLANT
SUT VIC AB 0 SH 27 (SUTURE) ×1 IMPLANT
SUT VIC AB 2-0 SH 27 (SUTURE)
SUT VIC AB 2-0 SH 27XBRD (SUTURE) ×1 IMPLANT
SYR BULB EAR ULCER 3OZ GRN STR (SYRINGE) ×2 IMPLANT
TOWEL GREEN STERILE FF (TOWEL DISPOSABLE) ×4 IMPLANT
TUBE CONNECTING 20X1/4 (TUBING) ×2 IMPLANT
UNDERPAD 30X36 HEAVY ABSORB (UNDERPADS AND DIAPERS) ×2 IMPLANT

## 2020-01-22 NOTE — Discharge Instructions (Addendum)
Regional Anesthesia Blocks  1. Numbness or the inability to move the "blocked" extremity may last from 3-48 hours after placement. The length of time depends on the medication injected and your individual response to the medication. If the numbness is not going away after 48 hours, call your surgeon.  2. The extremity that is blocked will need to be protected until the numbness is gone and the  Strength has returned. Because you cannot feel it, you will need to take extra care to avoid injury. Because it may be weak, you may have difficulty moving it or using it. You may not know what position it is in without looking at it while the block is in effect.  3. For blocks in the legs and feet, returning to weight bearing and walking needs to be done carefully. You will need to wait until the numbness is entirely gone and the strength has returned. You should be able to move your leg and foot normally before you try and bear weight or walk. You will need someone to be with you when you first try to ensure you do not fall and possibly risk injury.  4. Bruising and tenderness at the needle site are common side effects and will resolve in a few days.  5. Persistent numbness or new problems with movement should be communicated to the surgeon or the [facility] [phone] [place] [phone]).  Post Anesthesia Home Care Instructions  Activity: Get plenty of rest for the remainder of the day. A responsible individual must stay with you for 24 hours following the procedure.  For the next 24 hours, DO NOT: -Drive a car -Paediatric nurse -Drink alcoholic beverages -Take any medication unless instructed by your physician -Make any legal decisions or sign important papers.  Meals: Start with liquid foods such as gelatin or soup. Progress to regular foods as tolerated. Avoid greasy, spicy, heavy foods. If nausea and/or vomiting occur, drink only clear liquids until the  nausea and/or vomiting subsides. Call your physician if vomiting continues.  Special Instructions/Symptoms: Your throat may feel dry or sore from the anesthesia or the breathing tube placed in your throat during surgery. If this causes discomfort, gargle with warm salt water. The discomfort should disappear within 24 hours.  If you had a scopolamine patch placed behind your ear for the management of post- operative nausea and/or vomiting:  1. The medication in the patch is effective for 72 hours, after which it should be removed.  Wrap patch in a tissue and discard in the trash. Wash hands thoroughly with soap and water. 2. You may remove the patch earlier than 72 hours if you experience unpleasant side effects which may include dry mouth, dizziness or visual disturbances. 3. Avoid touching the patch. Wash your hands with soap and water after contact with the patch.    Michele Simmer, MD [facility]  Please read the following information regarding your care after surgery.  Medications  You only need a prescription for the narcotic pain medicine (ex. oxycodone, Percocet, Norco).  All of the other medicines listed below are available over the counter. X Aleve 2 pills twice a day for the first 3 days after surgery. X hydrocodone / apap as directed for severe pain  Narcotic pain medicine (ex. oxycodone, Percocet, Vicodin) will cause constipation.  To prevent this problem, take the following medicines while you are taking any pain medicine. X docusate sodium (Colace) 100 mg twice a day X senna (Senokot) 2  tablets twice a day  X To help prevent blood clots, take a baby aspirin (81 mg) twice a day for two weeks after surgery.  You should also get up every hour while you are awake to move around.    Weight Bearing ? Bear weight when you are able on your operated leg or foot. ? Bear weight only on your operated foot in the post-op shoe. X Do not bear any weight on the operated leg or foot.  Cast /  Splint / Dressing X Keep your splint, cast or dressing clean and dry.  Don't put anything (coat hanger, pencil, etc) down inside of it.  If it gets damp, use a hair dryer on the cool setting to dry it.  If it gets soaked, call the office to schedule an appointment for a cast change. ? Remove your dressing 3 days after surgery and cover the incisions with dry dressings.    After your dressing, cast or splint is removed; you may shower, but do not soak or scrub the wound.  Allow the water to run over it, and then gently pat it dry.  Swelling It is normal for you to have swelling where you had surgery.  To reduce swelling and pain, keep your toes above your nose for at least 3 days after surgery.  It may be necessary to keep your foot or leg elevated for several weeks.  If it hurts, it should be elevated.  Follow Up Call my office at [phone] when you are discharged from the hospital or surgery center to schedule an appointment to be seen two weeks after surgery.  Call my office at [phone] if you develop a fever >101.5 F, nausea, vomiting, bleeding from the surgical site or severe pain.

## 2020-01-22 NOTE — Anesthesia Postprocedure Evaluation (Signed)
Anesthesia Post Note  Patient: [name]  Procedure(s) Performed: Left talonavicular joint arthrodesis (Left Foot)     Patient location during evaluation: PACU Anesthesia Type: Regional and General Level of consciousness: awake and alert Pain management: pain level controlled Vital Signs Assessment: post-procedure vital signs reviewed and stable Respiratory status: spontaneous breathing, nonlabored ventilation, respiratory function stable and patient connected to nasal cannula oxygen Cardiovascular status: blood pressure returned to baseline and stable Postop Assessment: no apparent nausea or vomiting Anesthetic complications: no    Last Vitals:  Vitals:   01/22/20 1300 01/22/20 1345  BP: 132/83   Pulse: 76   Resp: 14   Temp:  36.4 C  SpO2: 98%     Last Pain:  Vitals:   01/22/20 1345  TempSrc:   PainSc: 3                  Dereka Lueras L Denesia Donelan

## 2020-01-22 NOTE — Anesthesia Procedure Notes (Signed)
Anesthesia Regional Block: Popliteal block   Pre-Anesthetic Checklist: ,, timeout performed, Correct Patient, Correct Site, Correct Laterality, Correct Procedure, Correct Position, site marked, Risks and benefits discussed,  Surgical consent,  Pre-op evaluation,  At surgeon's request and post-op pain management  Laterality: Left  Prep: Maximum Sterile Barrier Precautions used, chloraprep       Needles:  Injection technique: Single-shot  Needle Type: Echogenic Stimulator Needle     Needle Length: 9cm  Needle Gauge: 22     Additional Needles:   Procedures:,,,, ultrasound used (permanent image in chart),,,,  Narrative:  Start time: 01/22/2020 10:45 AM End time: 01/22/2020 10:55 AM Injection made incrementally with aspirations every 5 mL.  Performed by: Personally  Anesthesiologist: Freddrick March, MD  Additional Notes: Monitors applied. No increased pain on injection. No increased resistance to injection. Injection made in 5cc increments. Good needle visualization. Patient tolerated procedure well.

## 2020-01-22 NOTE — Transfer of Care (Signed)
Immediate Anesthesia Transfer of Care Note  Patient: [name]  Procedure(s) Performed: Left talonavicular joint arthrodesis (Left Foot)  Patient Location: PACU  Anesthesia Type:GA combined with regional for post-op pain  Level of Consciousness: drowsy and patient cooperative  Airway & Oxygen Therapy: Patient Spontanous Breathing and Patient connected to face mask oxygen  Post-op Assessment: Report given to RN and Post -op Vital signs reviewed and stable  Post vital signs: Reviewed and stable  Last Vitals:  Vitals Value Taken Time  BP 137/86 01/22/20 1254  Temp    Pulse 68 01/22/20 1255  Resp 13 01/22/20 1255  SpO2 100 % 01/22/20 1255  Vitals shown include unvalidated device data.  Last Pain:  Vitals:   01/22/20 1018  TempSrc: Oral  PainSc: 0-No pain      Patients Stated Pain Goal: 5 ([id number])  Complications: No apparent anesthesia complications

## 2020-01-22 NOTE — H&P (Signed)
[name] is an 69 y.o. female.   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  She has failed nonoperative treatment to date including activity modification, oral anti-inflammatories, shoewear modification and bracing.  She presents now for surgical treatment of this painful condition.  Past Medical History:  Diagnosis Date  . Atypical nevus 06/24/2008   Post Neck - Marked  . B12 deficiency   . Chronic pain   . History of shingles   . Hypertension   . Osteoarthritis   . SCC (squamous cell carcinoma) 03/14/2017   Left Hand - Well Diff    Past Surgical History:  Procedure Laterality Date  . ABDOMINAL HYSTERECTOMY    . ANKLE SURGERY Right   . BOTOX INJECTION     facial  . FACIAL COSMETIC SURGERY    . GASTRIC BYPASS    . REPLACEMENT TOTAL KNEE Bilateral     History reviewed. No pertinent family history. Social History:  reports that she has never smoked. She has never used smokeless tobacco. She reports current alcohol use. She reports that she does not use drugs.  Allergies:  Allergies  Allergen Reactions  . Oxycodone Itching  . Penicillins Other (See Comments)    Has had many times, no reaction  Has patient had a PCN reaction causing immediate rash, facial/tongue/throat swelling, SOB or lightheadedness with hypotension: n/a Has patient had a PCN reaction causing severe rash involving mucus membranes or skin necrosis: n/a Has patient had a PCN reaction that required hospitalization: n/a Has patient had a PCN reaction occurring within the last 10 years: n/a If all of the above answers are "NO", then may proceed with Cephalosporin use.   . Sulfa Antibiotics Itching  . Lorcet [Hydrocodone-Acetaminophen] Itching    Medications Prior to Admission  Medication Sig Dispense Refill  . azelastine (ASTELIN) 0.1 % nasal spray Place 1 spray into both nostrils 2  (two) times daily as needed for rhinitis or allergies.   5  . Calcium Carbonate-Vitamin D (CALCIUM + D PO) Take 1 tablet by mouth every evening.     . felodipine (PLENDIL) 5 MG 24 hr tablet Take 5 mg by mouth daily after breakfast.     . Ferrous Sulfate (IRON) 28 MG TABS Take 1 tablet by mouth daily after breakfast.     . fluticasone (FLONASE) 50 MCG/ACT nasal spray Place 1 spray into both nostrils 2 (two) times daily.     Marland Kitchen gabapentin (NEURONTIN) 600 MG tablet Take 600 mg by mouth 2 (two) times daily.     Marland Kitchen levocetirizine (XYZAL) 5 MG tablet Take 5 mg by mouth daily after breakfast.   3  . lidocaine (LIDODERM) 5 % Place 1 patch onto the skin daily. Remove & Discard patch within 12 hours or as directed by MD    . lisinopril (PRINIVIL,ZESTRIL) 10 MG tablet Take 10 mg by mouth daily after breakfast.     . LORazepam (ATIVAN) 0.5 MG tablet Take 0.5 mg by mouth every 8 (eight) hours as needed for anxiety.     . meloxicam (MOBIC) 7.5 MG tablet Take 15 mg by mouth 2 (two) times daily.     . montelukast (SINGULAIR) 10 MG tablet Take 10 mg by mouth daily after breakfast.   3  . Multiple Vitamin (MULTIVITAMIN) tablet Take 1 tablet by mouth daily.    . traMADol (ULTRAM) 50 MG tablet Take 50  mg by mouth every 6 (six) hours as needed for moderate pain or severe pain.     Marland Kitchen zolpidem (AMBIEN) 5 MG tablet Take 5 mg by mouth at bedtime as needed for sleep.    . baclofen (LIORESAL) 10 MG tablet Take 1 tablet (10 mg total) by mouth 2 (two) times daily. (Patient taking differently: Take 10 mg by mouth 2 (two) times daily as needed for muscle spasms. ) 60 tablet 2  . triamcinolone cream (KENALOG) 0.1 % Apply 1 application topically daily as needed (rash).   6    No results found for this or any previous visit (from the past 48 hour(s)). No results found.  Review of Systems no recent fever, chills, nausea, vomiting or changes in her appetite  Blood pressure (!) 123/49, pulse 61, temperature 97.9 F (36.6 C),  temperature source Oral, resp. rate 15, height 5\' 3"  (1.6 m), weight 89.5 kg, SpO2 97 %. Physical Exam  Well-nourished well-developed woman in no apparent distress.  Alert and oriented x4.  Normal mood and affect.  The left foot is tender to palpation over the talonavicular joint.  Skin is healthy and intact.  No lymphadenopathy.  Pulses are palpable in the foot.  Sensibility to light touch is intact dorsally and plantarly at the forefoot.  5 out of 5 strength in plantarflexion and dorsiflexion of the ankle and toes.   Assessment/Plan Left talonavicular joint arthritis -to the operating room today for left talonavicular joint arthrodesis.  The risks and benefits of the alternative treatment options have been discussed in detail.  The patient wishes to proceed with surgery and specifically understands risks of bleeding, infection, nerve damage, blood clots, need for additional surgery, amputation and death.   Wylene Simmer, MD 02/01/20, 11:25 AM

## 2020-01-22 NOTE — Anesthesia Preprocedure Evaluation (Addendum)
Anesthesia Evaluation  Patient identified by MRN, date of birth, ID band Patient awake    Reviewed: Allergy & Precautions, NPO status , Patient's Chart, lab work & pertinent test results  Airway Mallampati: II  TM Distance: >3 FB Neck ROM: Full    Dental no notable dental hx. (+) Teeth Intact, Dental Advisory Given   Pulmonary asthma ,    Pulmonary exam normal breath sounds clear to auscultation       Cardiovascular hypertension, Pt. on medications Normal cardiovascular exam Rhythm:Regular Rate:Normal     Neuro/Psych PSYCHIATRIC DISORDERS Anxiety negative neurological ROS     GI/Hepatic negative GI ROS, Neg liver ROS,   Endo/Other  negative endocrine ROS  Renal/GU negative Renal ROS  negative genitourinary   Musculoskeletal  (+) Arthritis ,   Abdominal   Peds  Hematology negative hematology ROS (+)   Anesthesia Other Findings   Reproductive/Obstetrics                          Anesthesia Physical Anesthesia Plan  ASA: II  Anesthesia Plan: General and Regional   Post-op Pain Management:  Regional for Post-op pain   Induction: Intravenous  PONV Risk Score and Plan: 3 and Ondansetron, Dexamethasone and Midazolam  Airway Management Planned: LMA  Additional Equipment:   Intra-op Plan:   Post-operative Plan: Extubation in OR  Informed Consent: I have reviewed the patients History and Physical, chart, labs and discussed the procedure including the risks, benefits and alternatives for the proposed anesthesia with the patient or authorized representative who has indicated his/her understanding and acceptance.     Dental advisory given  Plan Discussed with: CRNA  Anesthesia Plan Comments:         Anesthesia Quick Evaluation

## 2020-01-22 NOTE — Anesthesia Procedure Notes (Signed)
Procedure Name: LMA Insertion Date/Time: 01/22/2020 11:56 AM Performed by: Signe Colt, CRNA Pre-anesthesia Checklist: Patient identified, Emergency Drugs available, Suction available and Patient being monitored Patient Re-evaluated:Patient Re-evaluated prior to induction Oxygen Delivery Method: Circle system utilized Preoxygenation: Pre-oxygenation with 100% oxygen Induction Type: IV induction Ventilation: Mask ventilation without difficulty LMA: LMA inserted LMA Size: 4.0 Number of attempts: 1 Airway Equipment and Method: Bite block Placement Confirmation: positive ETCO2 Tube secured with: Tape Dental Injury: Teeth and Oropharynx as per pre-operative assessment

## 2020-01-22 NOTE — Progress Notes (Signed)
Assisted Dr. Lanetta Inch with left, ultrasound guided, popliteal block. Side rails up, monitors on throughout procedure. See vital signs in flow sheet. Tolerated Procedure well.

## 2020-01-22 NOTE — Op Note (Signed)
01/22/2020  1:08 PM  PATIENT:  [name]  69 y.o. female  PRE-OPERATIVE DIAGNOSIS:  Left talonavicular joint osteoarthritis  POST-OPERATIVE DIAGNOSIS:  Same  Procedure(s): 1.  Left talonavicular joint arthrodesis   2.  Left foot AP, lateral and oblique xrays  SURGEON:  Wylene Simmer, MD  ASSISTANT: none  ANESTHESIA:   General, regional  EBL:  minimal   TOURNIQUET:   Total Tourniquet Time Documented: Thigh (Left) - 36 minutes Total: Thigh (Left) - 36 minutes  COMPLICATIONS:  None apparent  DISPOSITION:  Extubated, awake and stable to recovery.  INDICATION FOR PROCEDURE: The patient is a 69 year old female with a long history of left foot pain due to talonavicular joint arthritis.  She has failed nonoperative treatment to date including activity modification, oral anti-inflammatories, shoewear modification and orthotics.  She presents now for left talonavicular joint arthrodesis.  The risks and benefits of the alternative treatment options have been discussed in detail.  The patient wishes to proceed with surgery and specifically understands risks of bleeding, infection, nerve damage, blood clots, need for additional surgery, amputation and death.  PROCEDURE IN DETAIL:  After pre operative consent was obtained, and the correct operative site was identified, the patient was brought to the operating room and placed supine on the OR table.  Anesthesia was administered.  Pre-operative antibiotics were administered.  A surgical timeout was taken.  Left lower extremity was prepped and draped in standard sterile fashion with a tourniquet around the thigh.  The extremity was elevated and the tourniquet was inflated to 250 mmHg.  A longitudinal incision was made over the talonavicular joint.  Dissection was carried down through the subcutaneous tissues.  The extensor retinaculum was incised.  Care was taken to protect branches of the superficial peroneal nerve.  The interval between the  tibialis anterior and extensor houses longus was developed.  The talonavicular joint capsule was incised and elevated medially and laterally.  Dorsal osteophytes were removed with a rondure.  The joint was opened.  There was nearly complete cartilage loss from both sides of the joint with exposed bone throughout the joint surfaces.  The remaining articular cartilage was removed with curettes and elevators.  The wound was irrigated copiously.  A small drill bit was then used to perforate the subchondral bone on both sides of the joint leaving the resultant bone graft in place.  The joint was reduced and provisionally pinned.  Radiographs confirmed appropriate alignment of the talonavicular joint.  An incision was made at the guidepin.  The pin was measured.  A 5 mm x 40 mm partially-threaded cannulated screw from the [employer] 4 mm / 5 mm set was selected.  It was advanced over the guidewire and compressed the talonavicular joint securely.  AP and lateral radiographs confirmed appropriate length and position of the screw.  A 16 mm arcus staple was selected.  The guide was used to drill holes for the legs of the staple.  The position of the staple was confirmed with AP and lateral fluoroscopy.  The staple was impacted into position was noted to have excellent purchase.  AP and lateral radiographs of the foot showed appropriate position of the talonavicular joint and appropriate position and length of all hardware.  The wound was irrigated copiously.  Vancomycin powder was sprinkled in the wound.  The retinaculum was repaired with 0 Vicryl sutures.  Subcutaneous tissues were approximated with Monocryl.  Skin incisions were closed with nylon.  Sterile dressings were applied followed by  a well-padded short leg splint.  The tourniquet was released after application of the dressings.  The patient was awakened from anesthesia and transported to the recovery room in stable condition.   FOLLOW UP PLAN:  Nonweightbearing on the left lower extremity in a splint.  Follow-up in the office in 2 weeks for suture removal and conversion to a short leg cast.  Aspirin for DVT prophylaxis.  RADIOGRAPHS: AP and lateral radiographs of the left foot are obtained intraoperatively.  These show interval arthrodesis of the talonavicular joint.  Hardware is appropriately positioned and of the appropriate lengths.  No other acute injuries are noted.

## 2020-01-23 ENCOUNTER — Encounter: Payer: Self-pay | Admitting: *Deleted

## 2020-03-18 ENCOUNTER — Telehealth: Payer: Self-pay | Admitting: Neurology

## 2020-03-18 NOTE — Telephone Encounter (Signed)
On 08/16/2015 the patient was given Baclofen 10mg  qhs. Is this ok to refill? Or does the patient need to contact her pcp?

## 2020-03-18 NOTE — Telephone Encounter (Signed)
Patient called in and left a message. She said Dr. Carles Collet prescribed her a mild muscle relaxer a while back and would like to see if she could get a prescription for that again

## 2020-03-18 NOTE — Telephone Encounter (Signed)
Yes.  I haven't given her that since 2017 and no reason that I can see for me to given that botox is taking care of issues that I treat her for

## 2020-03-18 NOTE — Telephone Encounter (Signed)
Spoke with patient gave her Dr Doristine Devoid recommendations. She gave a little push back wanting to know why Dr Tat would not refill her Baclofen. Advised her to contact her pcp for additional refills. She voiced understanding.

## 2020-04-01 ENCOUNTER — Other Ambulatory Visit: Payer: Self-pay

## 2020-04-01 ENCOUNTER — Ambulatory Visit (INDEPENDENT_AMBULATORY_CARE_PROVIDER_SITE_OTHER): Payer: Medicare PPO | Admitting: Neurology

## 2020-04-01 DIAGNOSIS — G245 Blepharospasm: Secondary | ICD-10-CM | POA: Diagnosis not present

## 2020-04-01 DIAGNOSIS — G5133 Clonic hemifacial spasm, bilateral: Secondary | ICD-10-CM | POA: Diagnosis not present

## 2020-04-01 MED ORDER — ONABOTULINUMTOXINA 100 UNITS IJ SOLR
25.0000 [IU] | Freq: Once | INTRAMUSCULAR | Status: AC
  Administered 2020-04-01: 25 [IU] via INTRAMUSCULAR

## 2020-04-01 MED ORDER — BACLOFEN 10 MG PO TABS: 10.0000 mg | ORAL_TABLET | Freq: Two times a day (BID) | ORAL | 0 refills | Status: DC | PRN

## 2020-04-01 NOTE — Procedures (Signed)
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  Risk discussed included, but were not limited pain and discomfort, bleeding, increased ptosis, increased facial asymmetry, bruising, excessive weakness, venous thrombosis, muscle atrophy and dysphagia.  A copy of the patient medication guide was given to the patient which explains the blackbox warning.  Patients identity and treatment sites confirmed Yes.  .  Details of Procedure: Skin was cleaned with alcohol.   Prior to injection, the needle plunger was aspirated to make sure the needle was not within a blood vessel.  There was no blood retrieved on aspiration.    Following is a summary of the muscles injected  And the amount of Botulinum toxin used:  Injections  Location Left  Right Units Number of sites        Corrugator      Frontalis      Lower Lid, Lateral 2.5 2.5 5.0 1 each side  Lower Lid Medial 2.5  2.5 1  Upper Lid, Lateral 2.5 2.5 5.0 1 each side  Upper Lid, Medial 2.5  2.5 1  Canthus 5.0 2.5 7.5 1 each side  Temporalis      Masseter      Procerus      Zygomaticus Major      TOTAL UNITS:   22.5    Agent: Botulinum Type A ( Onobotulinum Toxin type A ).  1 vials of Botox were used, each containing 50 units and freshly diluted with 2 mL of sterile, non-perserved saline   Total injected (Units): 25  Total wasted (Units): 0 Pt tolerated procedure well; no complications Reinjection is anticipated in 3 months.  Clinical comment:  Pt notes that she will not be able to follow up for 4 months due to vacation at 3 months and requests RX for baclofen which she has used in past for spasm when she has not been able to get in.   Told her we would provide today but didn't previously because haven't given since 2017 and had no documentation since that time.  Pt understands.  R/b/se discuscussed.

## 2020-04-27 ENCOUNTER — Other Ambulatory Visit: Payer: Self-pay | Admitting: Neurology

## 2020-05-01 ENCOUNTER — Other Ambulatory Visit: Payer: Self-pay | Admitting: Neurology

## 2020-05-14 ENCOUNTER — Encounter: Payer: Self-pay | Admitting: Neurology

## 2020-05-20 ENCOUNTER — Other Ambulatory Visit: Payer: Self-pay | Admitting: Neurology

## 2020-05-20 NOTE — Telephone Encounter (Signed)
Please call the patient and find out what is the need.  She told me when we filled baclofen in July that she only uses it if she is late/overdue for botox and it was true that she had not requested a refill from 2017-2021.  However, we sent 40 pills last month and she is already requesting refill and we did botox last month.

## 2020-05-25 NOTE — Telephone Encounter (Signed)
Spoke with patient and she stated she switched pharmacies. She stated they are automatically refilling her medication. Patient states she does not need the medication right now.

## 2020-07-02 ENCOUNTER — Ambulatory Visit: Payer: Medicare PPO | Admitting: Neurology

## 2020-07-09 ENCOUNTER — Ambulatory Visit (INDEPENDENT_AMBULATORY_CARE_PROVIDER_SITE_OTHER): Payer: Medicare PPO | Admitting: Neurology

## 2020-07-09 ENCOUNTER — Other Ambulatory Visit: Payer: Self-pay

## 2020-07-09 DIAGNOSIS — G5133 Clonic hemifacial spasm, bilateral: Secondary | ICD-10-CM | POA: Diagnosis not present

## 2020-07-09 DIAGNOSIS — G245 Blepharospasm: Secondary | ICD-10-CM | POA: Diagnosis not present

## 2020-07-09 MED ORDER — ONABOTULINUMTOXINA 100 UNITS IJ SOLR
25.0000 [IU] | Freq: Once | INTRAMUSCULAR | Status: AC
  Administered 2020-07-09: 25 [IU] via INTRAMUSCULAR

## 2020-07-09 NOTE — Procedures (Signed)
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  Risk discussed included, but were not limited pain and discomfort, bleeding, increased ptosis, increased facial asymmetry, bruising, excessive weakness, venous thrombosis, muscle atrophy and dysphagia.  A copy of the patient medication guide was given to the patient which explains the blackbox warning.  Patients identity and treatment sites confirmed Yes.  .  Details of Procedure: Skin was cleaned with alcohol.   Prior to injection, the needle plunger was aspirated to make sure the needle was not within a blood vessel.  There was no blood retrieved on aspiration.    Following is a summary of the muscles injected  And the amount of Botulinum toxin used:  Injections  Location Left  Right Units Number of sites        Corrugator      Frontalis      Lower Lid, Lateral 2.5 2.5 5.0 1 each side  Lower Lid Medial 2.5  2.5 1  Upper Lid, Lateral 2.5 2.5 5.0 1 each side  Upper Lid, Medial 2.5  2.5 1  Canthus 5.0 2.5 7.5 1 each side  Temporalis      Masseter      Procerus      Zygomaticus Major      TOTAL UNITS:   22.5    Agent: Botulinum Type A ( Onobotulinum Toxin type A ).  1 vials of Botox were used, each containing 50 units and freshly diluted with 2 mL of sterile, non-perserved saline   Total injected (Units): 25  Total wasted (Units): 0 Pt tolerated procedure well; no complications Reinjection is anticipated in 3 months.

## 2020-08-13 ENCOUNTER — Ambulatory Visit: Payer: Medicare PPO | Admitting: Neurology

## 2020-08-15 ENCOUNTER — Other Ambulatory Visit: Payer: Self-pay | Admitting: Neurology

## 2020-08-17 NOTE — Telephone Encounter (Signed)
Tee, I need you to call the patient about this RX request for baclofen.  See prior notes.  She had an old RX that lasted for like 4 years that she only used if she missed botox and requested an updated RX for the same thing.  i've now gotten several requests for new RX's for baclofen that I don't understand, since she is using botox.  Perhaps it is just that the pharmacy has her on autorefill and is generating the request?

## 2020-08-17 NOTE — Telephone Encounter (Signed)
Spoke with patient who states she does not need the medication. She states the pharmacy keeps trying to refill the medication.

## 2020-09-15 ENCOUNTER — Encounter: Payer: Self-pay | Admitting: Neurology

## 2020-09-15 NOTE — Progress Notes (Addendum)
12/15- Called [facility] to set up account for patient and give verbal script for Botox.   PA on valid from 10/02/20 to 10/01/21.

## 2020-10-05 DIAGNOSIS — J301 Allergic rhinitis due to pollen: Secondary | ICD-10-CM | POA: Diagnosis not present

## 2020-10-12 ENCOUNTER — Other Ambulatory Visit: Payer: Self-pay | Admitting: Neurology

## 2020-10-13 ENCOUNTER — Telehealth: Payer: Self-pay | Admitting: Neurology

## 2020-10-13 NOTE — Telephone Encounter (Signed)
Pt needs a refill on the baclofen  Sent to the [facility] [address]

## 2020-10-13 NOTE — Telephone Encounter (Signed)
Contacted pharmacy to make sure rx was received and was infomred that they have rx ready for pick up.  Spoke with patient and informed her that rx was sent to pharmacy yesterday and is ready for pickup. She voiced understanding.

## 2020-10-15 ENCOUNTER — Ambulatory Visit: Payer: Medicare PPO | Admitting: Neurology

## 2020-10-15 DIAGNOSIS — J301 Allergic rhinitis due to pollen: Secondary | ICD-10-CM | POA: Diagnosis not present

## 2020-10-15 DIAGNOSIS — J3089 Other allergic rhinitis: Secondary | ICD-10-CM | POA: Diagnosis not present

## 2020-10-21 ENCOUNTER — Telehealth: Payer: Self-pay | Admitting: Dermatology

## 2020-10-21 MED ORDER — CLOBETASOL PROPIONATE 0.05 % EX FOAM: CUTANEOUS | 1 refills | Status: DC

## 2020-10-21 NOTE — Telephone Encounter (Signed)
Needs new Rx for Clobetasol foam/solution [facility] [address]. Last OV was 11/17/2019 and she has skin check on 12/29/20. Chart # 9566 in message stack.

## 2020-10-21 NOTE — Telephone Encounter (Signed)
Patient's prescription sent to Pharmacy.

## 2020-10-28 DIAGNOSIS — J3089 Other allergic rhinitis: Secondary | ICD-10-CM | POA: Diagnosis not present

## 2020-10-28 DIAGNOSIS — J301 Allergic rhinitis due to pollen: Secondary | ICD-10-CM | POA: Diagnosis not present

## 2020-11-04 DIAGNOSIS — M5416 Radiculopathy, lumbar region: Secondary | ICD-10-CM | POA: Diagnosis not present

## 2020-11-05 DIAGNOSIS — J301 Allergic rhinitis due to pollen: Secondary | ICD-10-CM | POA: Diagnosis not present

## 2020-11-05 DIAGNOSIS — J3089 Other allergic rhinitis: Secondary | ICD-10-CM | POA: Diagnosis not present

## 2020-11-10 DIAGNOSIS — J301 Allergic rhinitis due to pollen: Secondary | ICD-10-CM | POA: Diagnosis not present

## 2020-11-10 NOTE — Progress Notes (Addendum)
1/10- fax over PA approval to [facility].  1/25- order is with insurance dept. Issue with day supply- told them botox is once ever 90 days.  1/28- with pharm dept. Verifying supply per day  2/3- Same issue; rep sent urgent email to ins dept to fix error.  2/7- " " " " 2/8- " " " " 2/9- spoke with supervisor who expedited issue with Insurance team and gave them my cell phone to call once they can schedule for delivery.   Per carolyn if [facility] can't deliver Botox before appt time OK to use [facility] for this one visit on 11/12/20.

## 2020-11-12 ENCOUNTER — Ambulatory Visit (INDEPENDENT_AMBULATORY_CARE_PROVIDER_SITE_OTHER): Payer: Medicare PPO | Admitting: Neurology

## 2020-11-12 ENCOUNTER — Other Ambulatory Visit: Payer: Self-pay

## 2020-11-12 DIAGNOSIS — G5133 Clonic hemifacial spasm, bilateral: Secondary | ICD-10-CM | POA: Diagnosis not present

## 2020-11-12 DIAGNOSIS — G245 Blepharospasm: Secondary | ICD-10-CM | POA: Diagnosis not present

## 2020-11-12 MED ORDER — ONABOTULINUMTOXINA 100 UNITS IJ SOLR
35.0000 [IU] | Freq: Once | INTRAMUSCULAR | Status: AC
  Administered 2020-11-12: 35 [IU] via INTRAMUSCULAR

## 2020-11-12 NOTE — Procedures (Signed)
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  Risk discussed included, but were not limited pain and discomfort, bleeding, increased ptosis, increased facial asymmetry, bruising, excessive weakness, venous thrombosis, muscle atrophy and dysphagia.  A copy of the patient medication guide was given to the patient which explains the blackbox warning.  Patients identity and treatment sites confirmed Yes.  .  Details of Procedure: Skin was cleaned with alcohol.   Prior to injection, the needle plunger was aspirated to make sure the needle was not within a blood vessel.  There was no blood retrieved on aspiration.    Following is a summary of the muscles injected  And the amount of Botulinum toxin used:  Injections  Location Left  Right Units Number of sites        Corrugator      Frontalis      Lower Lid, Lateral 2.5 2.5 5.0 1 each side  Lower Lid Medial 2.5  2.5 1  Upper Lid, Lateral 2.5 2.5 5.0 1 each side  Upper Lid, Medial      Canthus 5.0 2.5 7.5 1 each side  Temporalis      Masseter      Procerus      Zygomaticus Major      TOTAL UNITS:   20    Agent: Botulinum Type A ( Onobotulinum Toxin type A ).  1 vials of Botox were used, each containing 50 units and freshly diluted with 2 mL of sterile, non-perserved saline   Total injected (Units): 20  Total wasted (Units): 15 Pt tolerated procedure well; no complications Reinjection is anticipated in 3 months.

## 2020-11-15 ENCOUNTER — Telehealth: Payer: Self-pay | Admitting: Neurology

## 2020-11-15 ENCOUNTER — Other Ambulatory Visit: Payer: Self-pay | Admitting: Neurology

## 2020-11-15 NOTE — Telephone Encounter (Signed)
Spoke and gave her Dr Doristine Devoid recommendations.   [facility] and she kepes telling them that she does not need the medication. She states she does not need the medication and has asked them to not send our office requests. She states she will contact her pharmacy.   Dr Tat made aware.

## 2020-11-15 NOTE — Telephone Encounter (Signed)
[name], I need you to call the patient about request for baclofen.  She didn't go through this med for years and then wanted a RX stating that she only used it if she missed botox.  Now she is going through 40 pills a month.  I cannot refill this.  Baclofen is not a treatment for hemifacial spasm - botox is the treatment for that and she just got botox on Friday.  i'm concerned she is taking the baclofen for another reason that we don't have documented.  Regardless, I can't refill as just filled last month

## 2020-11-19 NOTE — Progress Notes (Signed)
2/18- received call from [facility] that patient insurance is not in network with them. That we must [facility] only for patient because Botox is ONLY covered under the medical benefits for plan.

## 2020-11-22 DIAGNOSIS — N390 Urinary tract infection, site not specified: Secondary | ICD-10-CM | POA: Diagnosis not present

## 2020-11-23 DIAGNOSIS — J301 Allergic rhinitis due to pollen: Secondary | ICD-10-CM | POA: Diagnosis not present

## 2020-11-30 DIAGNOSIS — J301 Allergic rhinitis due to pollen: Secondary | ICD-10-CM | POA: Diagnosis not present

## 2020-11-30 DIAGNOSIS — N390 Urinary tract infection, site not specified: Secondary | ICD-10-CM | POA: Diagnosis not present

## 2020-12-01 DIAGNOSIS — G514 Facial myokymia: Secondary | ICD-10-CM | POA: Diagnosis not present

## 2020-12-01 DIAGNOSIS — H43813 Vitreous degeneration, bilateral: Secondary | ICD-10-CM | POA: Diagnosis not present

## 2020-12-01 DIAGNOSIS — H16223 Keratoconjunctivitis sicca, not specified as Sjogren's, bilateral: Secondary | ICD-10-CM | POA: Diagnosis not present

## 2020-12-01 DIAGNOSIS — H2513 Age-related nuclear cataract, bilateral: Secondary | ICD-10-CM | POA: Diagnosis not present

## 2020-12-09 DIAGNOSIS — J301 Allergic rhinitis due to pollen: Secondary | ICD-10-CM | POA: Diagnosis not present

## 2020-12-20 DIAGNOSIS — J301 Allergic rhinitis due to pollen: Secondary | ICD-10-CM | POA: Diagnosis not present

## 2020-12-29 ENCOUNTER — Ambulatory Visit: Payer: Medicare PPO | Admitting: Dermatology

## 2020-12-29 ENCOUNTER — Encounter: Payer: Self-pay | Admitting: Dermatology

## 2020-12-29 ENCOUNTER — Other Ambulatory Visit: Payer: Self-pay

## 2020-12-29 DIAGNOSIS — Z1283 Encounter for screening for malignant neoplasm of skin: Secondary | ICD-10-CM

## 2020-12-29 DIAGNOSIS — Z85828 Personal history of other malignant neoplasm of skin: Secondary | ICD-10-CM | POA: Diagnosis not present

## 2020-12-29 DIAGNOSIS — L918 Other hypertrophic disorders of the skin: Secondary | ICD-10-CM

## 2020-12-29 DIAGNOSIS — Z86018 Personal history of other benign neoplasm: Secondary | ICD-10-CM | POA: Diagnosis not present

## 2020-12-29 DIAGNOSIS — Z8589 Personal history of malignant neoplasm of other organs and systems: Secondary | ICD-10-CM

## 2020-12-29 DIAGNOSIS — D1801 Hemangioma of skin and subcutaneous tissue: Secondary | ICD-10-CM

## 2021-01-05 DIAGNOSIS — J301 Allergic rhinitis due to pollen: Secondary | ICD-10-CM | POA: Diagnosis not present

## 2021-01-11 DIAGNOSIS — J301 Allergic rhinitis due to pollen: Secondary | ICD-10-CM | POA: Diagnosis not present

## 2021-01-12 DIAGNOSIS — K5730 Diverticulosis of large intestine without perforation or abscess without bleeding: Secondary | ICD-10-CM | POA: Diagnosis not present

## 2021-01-12 DIAGNOSIS — K648 Other hemorrhoids: Secondary | ICD-10-CM | POA: Diagnosis not present

## 2021-01-12 DIAGNOSIS — D122 Benign neoplasm of ascending colon: Secondary | ICD-10-CM | POA: Diagnosis not present

## 2021-01-12 DIAGNOSIS — Z1211 Encounter for screening for malignant neoplasm of colon: Secondary | ICD-10-CM | POA: Diagnosis not present

## 2021-01-12 DIAGNOSIS — D125 Benign neoplasm of sigmoid colon: Secondary | ICD-10-CM | POA: Diagnosis not present

## 2021-01-14 ENCOUNTER — Encounter: Payer: Self-pay | Admitting: Dermatology

## 2021-01-14 NOTE — Progress Notes (Signed)
   Follow-Up Visit   Subjective  [name] is a 70 y.o. female who presents for the following: Annual Exam (Full body skin check. Skin tag on right side of chest. No new concerns.).  General skin examination Location:  Duration:  Quality:  Associated Signs/Symptoms: Modifying Factors:  Severity:  Timing: Context:   Objective  Well appearing patient in no apparent distress; mood and affect are within normal limits. Objective  Neck - Posterior: No sign recurrence  Objective  Left Hand - Posterior: No sign recurrence  Objective  Left Thigh - Anterior, Mid Back: 2 to 3 mm smooth red papules  Objective  Head to Toe: No sign atypical moles or nonmelanoma skin cancer today.    A full examination was performed including scalp, head, eyes, ears, nose, lips, neck, chest, axillae, abdomen, back, buttocks, bilateral upper extremities, bilateral lower extremities, hands, feet, fingers, toes, fingernails, and toenails. All findings within normal limits unless otherwise noted below.  Areas beneath undergarments not fully examined   Assessment & Plan    History of atypical skin mole Neck - Posterior  Check.  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  Skin tag Right Anterior Neck  Can remove in future if patient chooses.      I, Lavonna Monarch, MD, have reviewed all documentation for this visit.  The documentation on 01/14/21 for the exam, diagnosis, procedures, and orders are all accurate and complete.

## 2021-01-19 DIAGNOSIS — D122 Benign neoplasm of ascending colon: Secondary | ICD-10-CM | POA: Diagnosis not present

## 2021-01-19 DIAGNOSIS — D125 Benign neoplasm of sigmoid colon: Secondary | ICD-10-CM | POA: Diagnosis not present

## 2021-01-31 DIAGNOSIS — J301 Allergic rhinitis due to pollen: Secondary | ICD-10-CM | POA: Diagnosis not present

## 2021-02-08 DIAGNOSIS — H1045 Other chronic allergic conjunctivitis: Secondary | ICD-10-CM | POA: Diagnosis not present

## 2021-02-08 DIAGNOSIS — J3089 Other allergic rhinitis: Secondary | ICD-10-CM | POA: Diagnosis not present

## 2021-02-08 DIAGNOSIS — J301 Allergic rhinitis due to pollen: Secondary | ICD-10-CM | POA: Diagnosis not present

## 2021-02-09 DIAGNOSIS — I10 Essential (primary) hypertension: Secondary | ICD-10-CM | POA: Diagnosis not present

## 2021-02-11 ENCOUNTER — Other Ambulatory Visit: Payer: Self-pay

## 2021-02-11 ENCOUNTER — Ambulatory Visit (INDEPENDENT_AMBULATORY_CARE_PROVIDER_SITE_OTHER): Payer: Medicare PPO | Admitting: Neurology

## 2021-02-11 DIAGNOSIS — G245 Blepharospasm: Secondary | ICD-10-CM

## 2021-02-11 MED ORDER — ONABOTULINUMTOXINA 100 UNITS IJ SOLR
25.0000 [IU] | Freq: Once | INTRAMUSCULAR | Status: AC
  Administered 2021-02-11: 20 [IU] via INTRAMUSCULAR

## 2021-02-11 MED ORDER — ONABOTULINUMTOXINA 100 UNITS IJ SOLR: 100.0000 [IU] | Freq: Once | INTRAMUSCULAR | Status: DC

## 2021-02-11 NOTE — Procedures (Signed)
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  Happy with efficacy   Consent obtained from: The patient Benefits discussed included, but were not limited to decreased muscle tightness, increased joint range of motion, and decreased pain.  Risk discussed included, but were not limited pain and discomfort, bleeding, increased ptosis, increased facial asymmetry, bruising, excessive weakness, venous thrombosis, muscle atrophy and dysphagia.  A copy of the patient medication guide was given to the patient which explains the blackbox warning.  Patients identity and treatment sites confirmed Yes.  .  Details of Procedure: Skin was cleaned with alcohol.   Prior to injection, the needle plunger was aspirated to make sure the needle was not within a blood vessel.  There was no blood retrieved on aspiration.    Following is a summary of the muscles injected  And the amount of Botulinum toxin used:  Injections  Location Left  Right Units Number of sites        Corrugator      Frontalis      Lower Lid, Lateral 2.5 2.5 5.0 1 each side  Lower Lid Medial 2.5  2.5 1  Upper Lid, Lateral 2.5 2.5 5.0 1 each side  Upper Lid, Medial      Canthus 5.0 2.5 7.5 1 each side  Temporalis      Masseter      Procerus      Zygomaticus Major      TOTAL UNITS:   20    Agent: Botulinum Type A ( Onobotulinum Toxin type A ).  1 vials of Botox were used, each containing 50 units and freshly diluted with 2 mL of sterile, non-perserved saline   Total injected (Units): 20  Total wasted (Units): 5 Pt tolerated procedure well; no complications Reinjection is anticipated in 3 months.

## 2021-02-14 DIAGNOSIS — J301 Allergic rhinitis due to pollen: Secondary | ICD-10-CM | POA: Diagnosis not present

## 2021-02-18 DIAGNOSIS — J301 Allergic rhinitis due to pollen: Secondary | ICD-10-CM | POA: Diagnosis not present

## 2021-02-24 DIAGNOSIS — J301 Allergic rhinitis due to pollen: Secondary | ICD-10-CM | POA: Diagnosis not present

## 2021-03-01 ENCOUNTER — Other Ambulatory Visit: Payer: Self-pay | Admitting: Dermatology

## 2021-03-03 DIAGNOSIS — J301 Allergic rhinitis due to pollen: Secondary | ICD-10-CM | POA: Diagnosis not present

## 2021-03-11 DIAGNOSIS — J301 Allergic rhinitis due to pollen: Secondary | ICD-10-CM | POA: Diagnosis not present

## 2021-03-21 DIAGNOSIS — J301 Allergic rhinitis due to pollen: Secondary | ICD-10-CM | POA: Diagnosis not present

## 2021-03-21 DIAGNOSIS — J3089 Other allergic rhinitis: Secondary | ICD-10-CM | POA: Diagnosis not present

## 2021-03-29 DIAGNOSIS — M5416 Radiculopathy, lumbar region: Secondary | ICD-10-CM | POA: Diagnosis not present

## 2021-04-06 DIAGNOSIS — J301 Allergic rhinitis due to pollen: Secondary | ICD-10-CM | POA: Diagnosis not present

## 2021-04-11 DIAGNOSIS — J301 Allergic rhinitis due to pollen: Secondary | ICD-10-CM | POA: Diagnosis not present

## 2021-04-14 DIAGNOSIS — J301 Allergic rhinitis due to pollen: Secondary | ICD-10-CM | POA: Diagnosis not present

## 2021-04-26 ENCOUNTER — Other Ambulatory Visit: Payer: Self-pay | Admitting: Dermatology

## 2021-04-28 DIAGNOSIS — J301 Allergic rhinitis due to pollen: Secondary | ICD-10-CM | POA: Diagnosis not present

## 2021-05-13 ENCOUNTER — Other Ambulatory Visit: Payer: Self-pay

## 2021-05-13 ENCOUNTER — Ambulatory Visit: Payer: Medicare PPO | Admitting: Neurology

## 2021-05-13 DIAGNOSIS — G245 Blepharospasm: Secondary | ICD-10-CM

## 2021-05-13 MED ORDER — ONABOTULINUMTOXINA 100 UNITS IJ SOLR: 200.0000 [IU] | Freq: Once | INTRAMUSCULAR | Status: DC

## 2021-05-13 MED ORDER — ONABOTULINUMTOXINA 100 UNITS IJ SOLR
100.0000 [IU] | Freq: Once | INTRAMUSCULAR | Status: AC
  Administered 2021-05-13: 20 [IU] via INTRAMUSCULAR

## 2021-05-13 NOTE — Procedures (Signed)
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  Happy with efficacy   Consent obtained from: The patient Benefits discussed included, but were not limited to decreased muscle tightness, increased joint range of motion, and decreased pain.  Risk discussed included, but were not limited pain and discomfort, bleeding, increased ptosis, increased facial asymmetry, bruising, excessive weakness, venous thrombosis, muscle atrophy and dysphagia.  A copy of the patient medication guide was given to the patient which explains the blackbox warning.  Patients identity and treatment sites confirmed Yes.  .  Details of Procedure: Skin was cleaned with alcohol.   Prior to injection, the needle plunger was aspirated to make sure the needle was not within a blood vessel.  There was no blood retrieved on aspiration.    Following is a summary of the muscles injected  And the amount of Botulinum toxin used:  Injections  Location Left  Right Units Number of sites        Corrugator      Frontalis      Lower Lid, Lateral 2.5 2.5 5.0 1 each side  Lower Lid Medial 2.5  2.5 1  Upper Lid, Lateral 2.5 2.5 5.0 1 each side  Upper Lid, Medial      Canthus 5.0 2.5 7.5 1 each side  Temporalis      Masseter      Procerus      Zygomaticus Major      TOTAL UNITS:   20    Agent: Botulinum Type A ( Onobotulinum Toxin type A ).  1 vials of Botox were used, each containing 50 units and freshly diluted with 2 mL of sterile, non-perserved saline   Total injected (Units): 20  Total wasted (Units): 80 Pt tolerated procedure well; no complications Reinjection is anticipated in 3 months.

## 2021-05-17 DIAGNOSIS — J301 Allergic rhinitis due to pollen: Secondary | ICD-10-CM | POA: Diagnosis not present

## 2021-05-18 DIAGNOSIS — Z1231 Encounter for screening mammogram for malignant neoplasm of breast: Secondary | ICD-10-CM | POA: Diagnosis not present

## 2021-05-27 DIAGNOSIS — J301 Allergic rhinitis due to pollen: Secondary | ICD-10-CM | POA: Diagnosis not present

## 2021-06-01 DIAGNOSIS — J301 Allergic rhinitis due to pollen: Secondary | ICD-10-CM | POA: Diagnosis not present

## 2021-06-07 DIAGNOSIS — J301 Allergic rhinitis due to pollen: Secondary | ICD-10-CM | POA: Diagnosis not present

## 2021-06-14 DIAGNOSIS — J301 Allergic rhinitis due to pollen: Secondary | ICD-10-CM | POA: Diagnosis not present

## 2021-06-21 DIAGNOSIS — J301 Allergic rhinitis due to pollen: Secondary | ICD-10-CM | POA: Diagnosis not present

## 2021-07-08 DIAGNOSIS — J301 Allergic rhinitis due to pollen: Secondary | ICD-10-CM | POA: Diagnosis not present

## 2021-07-08 DIAGNOSIS — J3089 Other allergic rhinitis: Secondary | ICD-10-CM | POA: Diagnosis not present

## 2021-07-20 DIAGNOSIS — J301 Allergic rhinitis due to pollen: Secondary | ICD-10-CM | POA: Diagnosis not present

## 2021-07-28 DIAGNOSIS — M5416 Radiculopathy, lumbar region: Secondary | ICD-10-CM | POA: Diagnosis not present

## 2021-07-29 DIAGNOSIS — F411 Generalized anxiety disorder: Secondary | ICD-10-CM | POA: Diagnosis not present

## 2021-07-29 DIAGNOSIS — J301 Allergic rhinitis due to pollen: Secondary | ICD-10-CM | POA: Diagnosis not present

## 2021-07-29 DIAGNOSIS — I10 Essential (primary) hypertension: Secondary | ICD-10-CM | POA: Diagnosis not present

## 2021-07-29 DIAGNOSIS — E669 Obesity, unspecified: Secondary | ICD-10-CM | POA: Diagnosis not present

## 2021-07-29 DIAGNOSIS — M1990 Unspecified osteoarthritis, unspecified site: Secondary | ICD-10-CM | POA: Diagnosis not present

## 2021-07-29 DIAGNOSIS — G4700 Insomnia, unspecified: Secondary | ICD-10-CM | POA: Diagnosis not present

## 2021-07-29 DIAGNOSIS — H04129 Dry eye syndrome of unspecified lacrimal gland: Secondary | ICD-10-CM | POA: Diagnosis not present

## 2021-07-29 DIAGNOSIS — B0223 Postherpetic polyneuropathy: Secondary | ICD-10-CM | POA: Diagnosis not present

## 2021-07-29 DIAGNOSIS — G8929 Other chronic pain: Secondary | ICD-10-CM | POA: Diagnosis not present

## 2021-08-04 DIAGNOSIS — J301 Allergic rhinitis due to pollen: Secondary | ICD-10-CM | POA: Diagnosis not present

## 2021-08-11 DIAGNOSIS — J301 Allergic rhinitis due to pollen: Secondary | ICD-10-CM | POA: Diagnosis not present

## 2021-08-11 DIAGNOSIS — J3089 Other allergic rhinitis: Secondary | ICD-10-CM | POA: Diagnosis not present

## 2021-08-12 ENCOUNTER — Other Ambulatory Visit: Payer: Self-pay

## 2021-08-12 ENCOUNTER — Ambulatory Visit: Payer: Medicare PPO | Admitting: Neurology

## 2021-08-12 DIAGNOSIS — G245 Blepharospasm: Secondary | ICD-10-CM | POA: Diagnosis not present

## 2021-08-12 MED ORDER — ONABOTULINUMTOXINA 100 UNITS IJ SOLR
100.0000 [IU] | Freq: Once | INTRAMUSCULAR | Status: AC
  Administered 2021-08-12: 20 [IU] via INTRAMUSCULAR

## 2021-08-12 NOTE — Procedures (Signed)
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  Happy with efficacy   Consent obtained from: The patient Benefits discussed included, but were not limited to decreased muscle tightness, increased joint range of motion, and decreased pain.  Risk discussed included, but were not limited pain and discomfort, bleeding, increased ptosis, increased facial asymmetry, bruising, excessive weakness, venous thrombosis, muscle atrophy and dysphagia.  A copy of the patient medication guide was given to the patient which explains the blackbox warning.  Patients identity and treatment sites confirmed Yes.  .  Details of Procedure: Skin was cleaned with alcohol.   Prior to injection, the needle plunger was aspirated to make sure the needle was not within a blood vessel.  There was no blood retrieved on aspiration.    Following is a summary of the muscles injected  And the amount of Botulinum toxin used:  Injections  Location Left  Right Units Number of sites        Corrugator      Frontalis      Lower Lid, Lateral 2.5 2.5 5.0 1 each side  Lower Lid Medial 2.5  2.5 1  Upper Lid, Lateral 2.5 2.5 5.0 1 each side  Upper Lid, Medial      Canthus 5.0 2.5 7.5 1 each side  Temporalis      Masseter      Procerus      Zygomaticus Major      TOTAL UNITS:   20    Agent: Botulinum Type A ( Onobotulinum Toxin type A ).  1 vials of Botox were used, each containing 50 units and freshly diluted with 2 mL of sterile, non-perserved saline   Total injected (Units): 20  Total wasted (Units): 80 Pt tolerated procedure well; no complications Reinjection is anticipated in 3 months.

## 2021-08-22 DIAGNOSIS — J301 Allergic rhinitis due to pollen: Secondary | ICD-10-CM | POA: Diagnosis not present

## 2021-08-29 DIAGNOSIS — M5414 Radiculopathy, thoracic region: Secondary | ICD-10-CM | POA: Diagnosis not present

## 2021-09-06 DIAGNOSIS — J301 Allergic rhinitis due to pollen: Secondary | ICD-10-CM | POA: Diagnosis not present

## 2021-09-06 DIAGNOSIS — J3089 Other allergic rhinitis: Secondary | ICD-10-CM | POA: Diagnosis not present

## 2021-09-06 DIAGNOSIS — H1045 Other chronic allergic conjunctivitis: Secondary | ICD-10-CM | POA: Diagnosis not present

## 2021-09-06 DIAGNOSIS — J3081 Allergic rhinitis due to animal (cat) (dog) hair and dander: Secondary | ICD-10-CM | POA: Diagnosis not present

## 2021-09-07 DIAGNOSIS — J3089 Other allergic rhinitis: Secondary | ICD-10-CM | POA: Diagnosis not present

## 2021-09-07 DIAGNOSIS — J3081 Allergic rhinitis due to animal (cat) (dog) hair and dander: Secondary | ICD-10-CM | POA: Diagnosis not present

## 2021-09-22 DIAGNOSIS — M5414 Radiculopathy, thoracic region: Secondary | ICD-10-CM | POA: Diagnosis not present

## 2021-09-29 DIAGNOSIS — J301 Allergic rhinitis due to pollen: Secondary | ICD-10-CM | POA: Diagnosis not present

## 2021-09-29 DIAGNOSIS — J3089 Other allergic rhinitis: Secondary | ICD-10-CM | POA: Diagnosis not present

## 2021-10-06 DIAGNOSIS — M5416 Radiculopathy, lumbar region: Secondary | ICD-10-CM | POA: Diagnosis not present

## 2021-10-13 ENCOUNTER — Telehealth: Payer: Self-pay

## 2021-10-13 NOTE — Telephone Encounter (Signed)
New message   Benefit Verification [id number] Submitted! For BV Basic submissions, please allow 3-5 business days for results.  For BV Full submissions, please allow 7 business days for results.

## 2021-10-18 DIAGNOSIS — R300 Dysuria: Secondary | ICD-10-CM | POA: Diagnosis not present

## 2021-10-25 ENCOUNTER — Telehealth: Payer: Self-pay

## 2021-10-25 DIAGNOSIS — J3089 Other allergic rhinitis: Secondary | ICD-10-CM | POA: Diagnosis not present

## 2021-10-25 DIAGNOSIS — J301 Allergic rhinitis due to pollen: Secondary | ICD-10-CM | POA: Diagnosis not present

## 2021-10-25 NOTE — Telephone Encounter (Signed)
BOTOX (onabotulinumtoxinA)  ACQUISITION - MAJOR MEDICAL BENEFITS List of Specialty Pharmacies may not include all available options. If preferred Specialty Pharmacy is not listed, check with payer.  [ ]  Buy and Bill [?] Buy and [place] Available [ ]  Specialty Pharmacy Required [ ]  Prescription Coverage Only - See Pharmacy Benefits Section [ ]  Payer will not release information  Specialty Pharmacies: [facility] [phone] [facility] [phone]  [place] reported a plan expiration date of Unknown. The plan's original effective date was 10/03/2019. The plan's renewal date is 10/02/2022. This plan runs on a calendar year basis. Once the medical individual out of pocket maximum has been met, the patient responsibility will be 0%. Under major medical benefits, the payer has a network of preferred specialty pharmacies. The option(s) shown above are preferred specialty pharmacies within the payer's network. Other specialty pharmacies may be available, however may not be one of the preferred pharmacies. Using a non-preferred pharmacy may result in differing benefits. For additional payer coverage criteria, please reference the BOTOX Policies and Forms link available at [email or web address].

## 2021-10-25 NOTE — Telephone Encounter (Signed)
New message    Received fax approval letter from [facility] to [place] [phone]

## 2021-11-01 DIAGNOSIS — J301 Allergic rhinitis due to pollen: Secondary | ICD-10-CM | POA: Diagnosis not present

## 2021-11-01 DIAGNOSIS — J3089 Other allergic rhinitis: Secondary | ICD-10-CM | POA: Diagnosis not present

## 2021-11-01 DIAGNOSIS — N390 Urinary tract infection, site not specified: Secondary | ICD-10-CM | POA: Diagnosis not present

## 2021-11-02 ENCOUNTER — Telehealth: Payer: Self-pay | Admitting: *Deleted

## 2021-11-02 NOTE — Telephone Encounter (Signed)
[place] to set up shipment [facility] said they needed more clarification on prescription.  She verified they received the approval letter from [facility]. She transferred me to Pharmacist who said they received the clarification but still needs more clarification, which was given verbally and 3 refills requested.   Called patient to let her know she will receive a call from them and if she does not want to answer the phone they will leave a message for her to call back. They will then call us for permission to receive shipment.  I let her know I told them we need it to arrive no later then February 9th because her appointment is early morning on the 10th.  We will talk on Friday mid afternoon to see where we are in the process.

## 2021-11-04 NOTE — Telephone Encounter (Signed)
Called [name] to see if all was in order she said yes and then called [facility] to give permission to receive and [name] said it had been cancelled.  Long story short they had already received copayment and then sent it to [facility] per [name]. [employer] and they said they never received it. Called [name] again explained this mess and she called [facility] , then [facility], then had a 3 way call to no avail.  Called [facility] and they took the prescription verbally and will call [name] to set up shipment. It was labeled as a rush. [name] know they will be calling her soon to set up account and collect co-pay and give permission to ship. I gave her their phone number. [phone]

## 2021-11-07 ENCOUNTER — Telehealth: Payer: Self-pay | Admitting: Neurology

## 2021-11-07 DIAGNOSIS — J301 Allergic rhinitis due to pollen: Secondary | ICD-10-CM | POA: Diagnosis not present

## 2021-11-07 DIAGNOSIS — J3089 Other allergic rhinitis: Secondary | ICD-10-CM | POA: Diagnosis not present

## 2021-11-07 NOTE — Telephone Encounter (Signed)
Patient wants [name] to return her call regarding botox.

## 2021-11-07 NOTE — Telephone Encounter (Signed)
F/u  I called and spoke with the patient Botox will be delivery tomorrow.   The patient had to pay a copayment  $ 128.00  Mail botoxsaving program information to the patient.

## 2021-11-09 DIAGNOSIS — E538 Deficiency of other specified B group vitamins: Secondary | ICD-10-CM | POA: Diagnosis not present

## 2021-11-09 DIAGNOSIS — F411 Generalized anxiety disorder: Secondary | ICD-10-CM | POA: Diagnosis not present

## 2021-11-09 DIAGNOSIS — Z79899 Other long term (current) drug therapy: Secondary | ICD-10-CM | POA: Diagnosis not present

## 2021-11-09 DIAGNOSIS — Z1331 Encounter for screening for depression: Secondary | ICD-10-CM | POA: Diagnosis not present

## 2021-11-09 DIAGNOSIS — Z0000 Encounter for general adult medical examination without abnormal findings: Secondary | ICD-10-CM | POA: Diagnosis not present

## 2021-11-09 DIAGNOSIS — I10 Essential (primary) hypertension: Secondary | ICD-10-CM | POA: Diagnosis not present

## 2021-11-11 ENCOUNTER — Ambulatory Visit: Payer: Medicare PPO | Admitting: Neurology

## 2021-11-11 ENCOUNTER — Other Ambulatory Visit: Payer: Self-pay

## 2021-11-11 DIAGNOSIS — J3089 Other allergic rhinitis: Secondary | ICD-10-CM | POA: Diagnosis not present

## 2021-11-11 DIAGNOSIS — J301 Allergic rhinitis due to pollen: Secondary | ICD-10-CM | POA: Diagnosis not present

## 2021-11-11 DIAGNOSIS — G245 Blepharospasm: Secondary | ICD-10-CM | POA: Diagnosis not present

## 2021-11-11 NOTE — Procedures (Signed)
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  Happy with efficacy   Consent obtained from: The patient Benefits discussed included, but were not limited to decreased muscle tightness, increased joint range of motion, and decreased pain.  Risk discussed included, but were not limited pain and discomfort, bleeding, increased ptosis, increased facial asymmetry, bruising, excessive weakness, venous thrombosis, muscle atrophy and dysphagia.  A copy of the patient medication guide was given to the patient which explains the blackbox warning.  Patients identity and treatment sites confirmed Yes.  .  Details of Procedure: Skin was cleaned with alcohol.   Prior to injection, the needle plunger was aspirated to make sure the needle was not within a blood vessel.  There was no blood retrieved on aspiration.    Following is a summary of the muscles injected  And the amount of Botulinum toxin used:  Injections  Location Left  Right Units Number of sites        Corrugator      Frontalis      Lower Lid, Lateral 2.5 2.5 5.0 1 each side  Lower Lid Medial 2.5  2.5 1  Upper Lid, Lateral 2.5 2.5 5.0 1 each side  Upper Lid, Medial      Canthus 5.0 2.5 7.5 1 each side  Temporalis      Masseter      Procerus      Zygomaticus Major      TOTAL UNITS:   20    Agent: Botulinum Type A ( Onobotulinum Toxin type A ).  1 vials of Botox were used, each containing 50 units and freshly diluted with 2 mL of sterile, non-perserved saline   Total injected (Units): 20  Total wasted (Units): 80 Pt tolerated procedure well; no complications with exception of small area of ecchymosis over upper lateral orbicularis occuli Reinjection is anticipated in 3 months.

## 2021-11-22 DIAGNOSIS — J301 Allergic rhinitis due to pollen: Secondary | ICD-10-CM | POA: Diagnosis not present

## 2021-11-22 DIAGNOSIS — J3089 Other allergic rhinitis: Secondary | ICD-10-CM | POA: Diagnosis not present

## 2021-11-22 DIAGNOSIS — J3081 Allergic rhinitis due to animal (cat) (dog) hair and dander: Secondary | ICD-10-CM | POA: Diagnosis not present

## 2021-11-29 DIAGNOSIS — J3089 Other allergic rhinitis: Secondary | ICD-10-CM | POA: Diagnosis not present

## 2021-11-29 DIAGNOSIS — J301 Allergic rhinitis due to pollen: Secondary | ICD-10-CM | POA: Diagnosis not present

## 2021-12-01 DIAGNOSIS — J301 Allergic rhinitis due to pollen: Secondary | ICD-10-CM | POA: Diagnosis not present

## 2021-12-05 DIAGNOSIS — G514 Facial myokymia: Secondary | ICD-10-CM | POA: Diagnosis not present

## 2021-12-05 DIAGNOSIS — H43813 Vitreous degeneration, bilateral: Secondary | ICD-10-CM | POA: Diagnosis not present

## 2021-12-05 DIAGNOSIS — H16223 Keratoconjunctivitis sicca, not specified as Sjogren's, bilateral: Secondary | ICD-10-CM | POA: Diagnosis not present

## 2021-12-05 DIAGNOSIS — H2513 Age-related nuclear cataract, bilateral: Secondary | ICD-10-CM | POA: Diagnosis not present

## 2021-12-06 DIAGNOSIS — J3089 Other allergic rhinitis: Secondary | ICD-10-CM | POA: Diagnosis not present

## 2021-12-06 DIAGNOSIS — J301 Allergic rhinitis due to pollen: Secondary | ICD-10-CM | POA: Diagnosis not present

## 2021-12-13 DIAGNOSIS — J3089 Other allergic rhinitis: Secondary | ICD-10-CM | POA: Diagnosis not present

## 2021-12-13 DIAGNOSIS — J301 Allergic rhinitis due to pollen: Secondary | ICD-10-CM | POA: Diagnosis not present

## 2021-12-15 DIAGNOSIS — M5414 Radiculopathy, thoracic region: Secondary | ICD-10-CM | POA: Diagnosis not present

## 2021-12-27 DIAGNOSIS — J301 Allergic rhinitis due to pollen: Secondary | ICD-10-CM | POA: Diagnosis not present

## 2021-12-27 DIAGNOSIS — J3089 Other allergic rhinitis: Secondary | ICD-10-CM | POA: Diagnosis not present

## 2021-12-29 DIAGNOSIS — M5416 Radiculopathy, lumbar region: Secondary | ICD-10-CM | POA: Diagnosis not present

## 2022-01-10 DIAGNOSIS — J3089 Other allergic rhinitis: Secondary | ICD-10-CM | POA: Diagnosis not present

## 2022-01-10 DIAGNOSIS — J3081 Allergic rhinitis due to animal (cat) (dog) hair and dander: Secondary | ICD-10-CM | POA: Diagnosis not present

## 2022-01-10 DIAGNOSIS — J301 Allergic rhinitis due to pollen: Secondary | ICD-10-CM | POA: Diagnosis not present

## 2022-01-16 DIAGNOSIS — J3081 Allergic rhinitis due to animal (cat) (dog) hair and dander: Secondary | ICD-10-CM | POA: Diagnosis not present

## 2022-01-16 DIAGNOSIS — J3089 Other allergic rhinitis: Secondary | ICD-10-CM | POA: Diagnosis not present

## 2022-01-16 DIAGNOSIS — J301 Allergic rhinitis due to pollen: Secondary | ICD-10-CM | POA: Diagnosis not present

## 2022-01-18 ENCOUNTER — Telehealth: Payer: Self-pay | Admitting: Neurology

## 2022-01-18 MED ORDER — BACLOFEN 10 MG PO TABS: 10.0000 mg | ORAL_TABLET | Freq: Every day | ORAL | 0 refills | Status: DC | PRN

## 2022-01-18 NOTE — Telephone Encounter (Signed)
Patient would like to know if she can get some muscle relaxer's again, due to her twitches returning. She said its very frustrating.  ?

## 2022-01-18 NOTE — Telephone Encounter (Signed)
Called pateint and she is very thankful. She is going to [place] for a month and is looking forward to enjoying her trip.  ?

## 2022-01-18 NOTE — Telephone Encounter (Signed)
[facility] ?

## 2022-01-19 DIAGNOSIS — M5414 Radiculopathy, thoracic region: Secondary | ICD-10-CM | POA: Diagnosis not present

## 2022-01-24 ENCOUNTER — Ambulatory Visit (HOSPITAL_BASED_OUTPATIENT_CLINIC_OR_DEPARTMENT_OTHER): Payer: Medicare PPO | Admitting: Family Medicine

## 2022-01-24 ENCOUNTER — Encounter (HOSPITAL_BASED_OUTPATIENT_CLINIC_OR_DEPARTMENT_OTHER): Payer: Self-pay | Admitting: Family Medicine

## 2022-01-24 DIAGNOSIS — M545 Low back pain, unspecified: Secondary | ICD-10-CM | POA: Diagnosis not present

## 2022-01-24 DIAGNOSIS — G8929 Other chronic pain: Secondary | ICD-10-CM | POA: Insufficient documentation

## 2022-01-24 DIAGNOSIS — F419 Anxiety disorder, unspecified: Secondary | ICD-10-CM | POA: Diagnosis not present

## 2022-01-24 DIAGNOSIS — I10 Essential (primary) hypertension: Secondary | ICD-10-CM

## 2022-01-24 DIAGNOSIS — J3089 Other allergic rhinitis: Secondary | ICD-10-CM

## 2022-01-24 DIAGNOSIS — G4700 Insomnia, unspecified: Secondary | ICD-10-CM

## 2022-01-24 NOTE — Assessment & Plan Note (Addendum)
Patient is on notable amount of tramadol, taking 50 mg 6 times daily.  She is aware of risk and potential side effects with being on higher dose of this medication chronically ?Can continue with current regimen including gabapentin, lidocaine patches, tramadol, regular follow-up with interventional pain management specialist ?Not needing refill at this time ?

## 2022-01-24 NOTE — Assessment & Plan Note (Signed)
Can continue with current regimen, did discuss risks related to Ambien use, particularly among elderly patients and especially female patients.  Also discussed that utilizing lowest effective dose would be recommended in order to try and help lower potential risk of side effects, she does indicate that she has been on the 5 mg dose ?Not needing refill at this time, will allow for refills in the future when needed ?

## 2022-01-24 NOTE — Assessment & Plan Note (Signed)
Managing with as needed use of Ativan which tends to be about 1-2 times daily, generally will use in the evening to help with symptoms and to help with getting to sleep ?Generally, not an ideal regimen for managing anxiety, however patient has been on this for many years and is reluctant to make any changes ?

## 2022-01-24 NOTE — Assessment & Plan Note (Signed)
Patient utilizes intranasal steroid spray, occasionally intranasal antihistamine spray. She also use oral antihistamine at times to help control symptoms.  Denies any current issues at present ?

## 2022-01-24 NOTE — Progress Notes (Signed)
? ?New Patient Office Visit ? ?Subjective   ? ?Patient ID: [name], female    DOB: 1950/11/14  Age: 71 y.o. MRN: [id number] ? ?CC:  ?Chief Complaint  ?Patient presents with  ? New Patient (Initial Visit)  ? ? ?HPI ?[name] presents to establish care ?Last PCP - followed regularly with Dr. Felipa Eth through [facility] ? ?Chronic low back pain: Was being managed by PCP as well as pain management specialist.  Medications related to this include gabapentin, 5% lidocaine patch, tramadol.  She indicates that she will use about 1 patch of lidocaine per day, will utilize 50 mg tramadol about 6 times per day, gabapentin 600 mg twice daily.  She also follows with pain management specialist and sees them about every 3 months and will have injections completed through them.  She has been having chronic low back pain for greater than 20 years ? ?Sleep issues: Feels that a lot of this is related to her back pain as well as some underlying anxiety.  She does utilize Ambien as needed to help with this, will typically uses nightly although will have some nights where she does not use this.  Chart indicates 10 mg dose, but she indicates that she has been on 5 mg nightly dose for this.  She will also utilize Ativan, typically in the evening to help with anxiety and to "wind down." ? ?HTN: Reports that she currently takes lisinopril 10 mg daily as well as felodipine 5 mg daily.  Denies any current issues related chest pain or headaches. ? ?Patient is originally from [place], [place]. Patient did work as a Health and safety inspector, now retired. Patient enjoys going to the pool here, has a dog at home, visiting with friends, reading; used to ballroom dance. ? ?Outpatient Encounter Medications as of 01/24/2022  ?Medication Sig  ? azelastine (ASTELIN) 0.1 % nasal spray Place 1 spray into both nostrils 2 (two) times daily as needed for rhinitis or allergies.   ? baclofen (LIORESAL) 10 MG tablet Take 1 tablet (10 mg total) by mouth daily as needed for  muscle spasms.  ? Calcium Carbonate-Vitamin D (CALCIUM + D PO) Take 1 tablet by mouth every evening.   ? clobetasol (OLUX) 0.05 % topical foam APPLY TO THE SCALP EVERY DAY AS DIRECTED  ? cycloSPORINE (RESTASIS) 0.05 % ophthalmic emulsion Restasis 0.05 % eye drops in a dropperette  ? felodipine (PLENDIL) 5 MG 24 hr tablet Take 5 mg by mouth daily after breakfast.   ? Ferrous Sulfate (IRON) 28 MG TABS Take 1 tablet by mouth daily after breakfast.   ? fluticasone (FLONASE) 50 MCG/ACT nasal spray Place 1 spray into both nostrils 2 (two) times daily.   ? gabapentin (NEURONTIN) 600 MG tablet Take 600 mg by mouth 2 (two) times daily.   ? levocetirizine (XYZAL) 5 MG tablet Take 5 mg by mouth daily after breakfast.   ? lidocaine (LIDODERM) 5 % Place 1 patch onto the skin daily. Remove & Discard patch within 12 hours or as directed by MD  ? lisinopril (PRINIVIL,ZESTRIL) 10 MG tablet Take 10 mg by mouth daily after breakfast.   ? LORazepam (ATIVAN) 0.5 MG tablet Take 0.5 mg by mouth every 8 (eight) hours as needed for anxiety.   ? montelukast (SINGULAIR) 10 MG tablet Take 10 mg by mouth daily after breakfast.   ? Multiple Vitamin (MULTIVITAMIN) tablet Take 1 tablet by mouth daily.  ? traMADol (ULTRAM) 50 MG tablet   ? triamcinolone cream (KENALOG) 0.1 %  Apply 1 application topically daily as needed (rash).   ? zolpidem (AMBIEN) 5 MG tablet Take 5 mg by mouth at bedtime as needed for sleep.  ? ?No facility-administered encounter medications on file as of 01/24/2022.  ? ? ?Past Medical History:  ?Diagnosis Date  ? 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 Post Neck - Marked  ? B12 deficiency   ? Chronic pain   ? History of shingles   ? Hypertension   ? Osteoarthritis   ? SCC (squamous cell carcinoma) 03/14/2017  ? Left Hand - Well Diff  ? ? ?Past Surgical History:  ?Procedure Laterality Date  ? ABDOMINAL HYSTERECTOMY    ? ANKLE SURGERY Right   ? BOTOX INJECTION    ? facial  ? FACIAL COSMETIC SURGERY    ? FOOT ARTHRODESIS Left 01/22/2020  ?  Procedure: Left talonavicular joint arthrodesis;  Surgeon: Wylene Simmer, MD;  Location: [facility];  Service: Orthopedics;  Laterality: Left;  ? GASTRIC BYPASS    ? REPLACEMENT TOTAL KNEE Bilateral   ? ? ?History reviewed. No pertinent family history. ? ?Social History  ? ?Socioeconomic History  ? Marital status: Widowed  ?  Spouse name: Not on file  ? Number of children: Not on file  ? Years of education: Not on file  ? Highest education level: Not on file  ?Occupational History  ?  Employer: RETIRED  ?  Comment: HS principle  ?Tobacco Use  ? Smoking status: Never  ? Smokeless tobacco: Never  ?Substance and Sexual Activity  ? Alcohol use: Yes  ?  Comment: 1-2 times per month  ? Drug use: No  ? Sexual activity: Not on file  ?Other Topics Concern  ? Not on file  ?Social History Narrative  ? Not on file  ? ?Social Determinants of Health  ? ?Financial Resource Strain: Not on file  ?Food Insecurity: Not on file  ?Transportation Needs: Not on file  ?Physical Activity: Not on file  ?Stress: Not on file  ?Social Connections: Not on file  ?Intimate Partner Violence: Not on file  ? ? ?Objective   ? ?BP 133/73   Pulse 74   Temp 97.6 ?F (36.4 ?C) (Oral)   Ht '5\' 3"'$  (1.6 m)   Wt 193 lb 4.8 oz (87.7 kg)   SpO2 96%   BMI 34.24 kg/m?  ? ?Physical Exam ? ?71 yo female in no acute distress ?Cardiovascular exam with regular rate and rhythm, no murmur appreciated ?Lungs clear to auscultation bilaterally ? ?Assessment & Plan:  ? ?Problem List Items Addressed This Visit   ? ?  ? Cardiovascular and Mediastinum  ? Hypertension  ?  Blood pressure controlled in office today, can continue with lisinopril and amlodipine ?Recommend intermittent monitoring of blood pressure at home, DASH diet ? ?  ?  ?  ? Other  ? Chronic low back pain  ?  Patient is on notable amount of tramadol, taking 50 mg 6 times daily.  She is aware of risk and potential side effects with being on higher dose of this medication chronically ?Can  continue with current regimen including gabapentin, lidocaine patches, tramadol, regular follow-up with interventional pain management specialist ?Not needing refill at this time ? ?  ?  ? Insomnia  ?  Can continue with current regimen, did discuss risks related to Ambien use, particularly among elderly patients and especially female patients.  Also discussed that utilizing lowest effective dose would be recommended in order to try and help lower  potential risk of side effects, she does indicate that she has been on the 5 mg dose ?Not needing refill at this time, will allow for refills in the future when needed ? ?  ?  ? Environmental and seasonal allergies  ?  Patient utilizes intranasal steroid spray, occasionally intranasal antihistamine spray. She also use oral antihistamine at times to help control symptoms.  Denies any current issues at present ? ?  ?  ? Anxiety  ?  Managing with as needed use of Ativan which tends to be about 1-2 times daily, generally will use in the evening to help with symptoms and to help with getting to sleep ?Generally, not an ideal regimen for managing anxiety, however patient has been on this for many years and is reluctant to make any changes ? ?  ?  ? ? ?Return in about 4 months (around 05/26/2022) for Follow-up.  Or sooner as needed ? ?Keishia Ground J [place], MD ? ? ?

## 2022-01-24 NOTE — Assessment & Plan Note (Signed)
Blood pressure controlled in office today, can continue with lisinopril and amlodipine ?Recommend intermittent monitoring of blood pressure at home, DASH diet ?

## 2022-01-25 DIAGNOSIS — J3081 Allergic rhinitis due to animal (cat) (dog) hair and dander: Secondary | ICD-10-CM | POA: Diagnosis not present

## 2022-01-25 DIAGNOSIS — J301 Allergic rhinitis due to pollen: Secondary | ICD-10-CM | POA: Diagnosis not present

## 2022-01-25 DIAGNOSIS — J3089 Other allergic rhinitis: Secondary | ICD-10-CM | POA: Diagnosis not present

## 2022-01-27 DIAGNOSIS — J301 Allergic rhinitis due to pollen: Secondary | ICD-10-CM | POA: Diagnosis not present

## 2022-01-27 DIAGNOSIS — J3089 Other allergic rhinitis: Secondary | ICD-10-CM | POA: Diagnosis not present

## 2022-01-27 DIAGNOSIS — J3081 Allergic rhinitis due to animal (cat) (dog) hair and dander: Secondary | ICD-10-CM | POA: Diagnosis not present

## 2022-02-10 ENCOUNTER — Ambulatory Visit: Payer: Medicare PPO | Admitting: Neurology

## 2022-02-17 ENCOUNTER — Ambulatory Visit: Payer: Medicare PPO | Admitting: Neurology

## 2022-02-17 DIAGNOSIS — J3089 Other allergic rhinitis: Secondary | ICD-10-CM | POA: Diagnosis not present

## 2022-02-17 DIAGNOSIS — G5133 Clonic hemifacial spasm, bilateral: Secondary | ICD-10-CM

## 2022-02-17 DIAGNOSIS — G245 Blepharospasm: Secondary | ICD-10-CM | POA: Diagnosis not present

## 2022-02-17 DIAGNOSIS — J301 Allergic rhinitis due to pollen: Secondary | ICD-10-CM | POA: Diagnosis not present

## 2022-02-17 DIAGNOSIS — J3081 Allergic rhinitis due to animal (cat) (dog) hair and dander: Secondary | ICD-10-CM | POA: Diagnosis not present

## 2022-02-17 MED ORDER — ONABOTULINUMTOXINA 100 UNITS IJ SOLR
100.0000 [IU] | Freq: Once | INTRAMUSCULAR | Status: AC
  Administered 2022-02-17: 20 [IU] via INTRAMUSCULAR

## 2022-02-17 NOTE — Procedures (Signed)
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  Happy with efficacy but overdue for injections and started twitching a month ago, which was about 1-2 weeks before when injections were supposed to be due   Consent obtained from: The patient Benefits discussed included, but were not limited to decreased muscle tightness, increased joint range of motion, and decreased pain.  Risk discussed included, but were not limited pain and discomfort, bleeding, increased ptosis, increased facial asymmetry, bruising, excessive weakness, venous thrombosis, muscle atrophy and dysphagia.  A copy of the patient medication guide was given to the patient which explains the blackbox warning.  Patients identity and treatment sites confirmed Yes.  .  Details of Procedure: Skin was cleaned with alcohol.   Prior to injection, the needle plunger was aspirated to make sure the needle was not within a blood vessel.  There was no blood retrieved on aspiration.    Following is a summary of the muscles injected  And the amount of Botulinum toxin used:  Injections  Location Left  Right Units Number of sites        Corrugator      Frontalis      Lower Lid, Lateral 2.5 2.5 5.0 1 each side  Lower Lid Medial 2.5  2.5 1  Upper Lid, Lateral 2.5 2.5 5.0 1 each side  Upper Lid, Medial      Canthus 5.0 2.5 7.5 1 each side  Temporalis      Masseter      Procerus      Zygomaticus Major      TOTAL UNITS:   20    Agent: Botulinum Type A ( Onobotulinum Toxin type A ).  1 vials of Botox were used, each containing 50 units and freshly diluted with 2 mL of sterile, non-perserved saline   Total injected (Units): 20  Total wasted (Units): 80 Pt tolerated procedure well; no complications with exception of small area of ecchymosis over upper lateral orbicularis occuli Reinjection is anticipated in 3 months.

## 2022-02-25 ENCOUNTER — Other Ambulatory Visit: Payer: Self-pay | Admitting: Neurology

## 2022-03-01 DIAGNOSIS — J301 Allergic rhinitis due to pollen: Secondary | ICD-10-CM | POA: Diagnosis not present

## 2022-03-01 DIAGNOSIS — J3089 Other allergic rhinitis: Secondary | ICD-10-CM | POA: Diagnosis not present

## 2022-03-07 DIAGNOSIS — J3089 Other allergic rhinitis: Secondary | ICD-10-CM | POA: Diagnosis not present

## 2022-03-07 DIAGNOSIS — J3081 Allergic rhinitis due to animal (cat) (dog) hair and dander: Secondary | ICD-10-CM | POA: Diagnosis not present

## 2022-03-07 DIAGNOSIS — J301 Allergic rhinitis due to pollen: Secondary | ICD-10-CM | POA: Diagnosis not present

## 2022-03-14 DIAGNOSIS — J3081 Allergic rhinitis due to animal (cat) (dog) hair and dander: Secondary | ICD-10-CM | POA: Diagnosis not present

## 2022-03-14 DIAGNOSIS — J3089 Other allergic rhinitis: Secondary | ICD-10-CM | POA: Diagnosis not present

## 2022-03-14 DIAGNOSIS — J301 Allergic rhinitis due to pollen: Secondary | ICD-10-CM | POA: Diagnosis not present

## 2022-03-21 DIAGNOSIS — J301 Allergic rhinitis due to pollen: Secondary | ICD-10-CM | POA: Diagnosis not present

## 2022-03-21 DIAGNOSIS — J3089 Other allergic rhinitis: Secondary | ICD-10-CM | POA: Diagnosis not present

## 2022-03-21 DIAGNOSIS — J3081 Allergic rhinitis due to animal (cat) (dog) hair and dander: Secondary | ICD-10-CM | POA: Diagnosis not present

## 2022-03-31 DIAGNOSIS — J301 Allergic rhinitis due to pollen: Secondary | ICD-10-CM | POA: Diagnosis not present

## 2022-03-31 DIAGNOSIS — J3089 Other allergic rhinitis: Secondary | ICD-10-CM | POA: Diagnosis not present

## 2022-03-31 DIAGNOSIS — J3081 Allergic rhinitis due to animal (cat) (dog) hair and dander: Secondary | ICD-10-CM | POA: Diagnosis not present

## 2022-04-10 DIAGNOSIS — J301 Allergic rhinitis due to pollen: Secondary | ICD-10-CM | POA: Diagnosis not present

## 2022-04-10 DIAGNOSIS — J3081 Allergic rhinitis due to animal (cat) (dog) hair and dander: Secondary | ICD-10-CM | POA: Diagnosis not present

## 2022-04-10 DIAGNOSIS — J3089 Other allergic rhinitis: Secondary | ICD-10-CM | POA: Diagnosis not present

## 2022-04-25 DIAGNOSIS — J3081 Allergic rhinitis due to animal (cat) (dog) hair and dander: Secondary | ICD-10-CM | POA: Diagnosis not present

## 2022-04-25 DIAGNOSIS — J3089 Other allergic rhinitis: Secondary | ICD-10-CM | POA: Diagnosis not present

## 2022-04-25 DIAGNOSIS — J301 Allergic rhinitis due to pollen: Secondary | ICD-10-CM | POA: Diagnosis not present

## 2022-05-04 DIAGNOSIS — R52 Pain, unspecified: Secondary | ICD-10-CM | POA: Diagnosis not present

## 2022-05-04 DIAGNOSIS — Z03818 Encounter for observation for suspected exposure to other biological agents ruled out: Secondary | ICD-10-CM | POA: Diagnosis not present

## 2022-05-04 DIAGNOSIS — B349 Viral infection, unspecified: Secondary | ICD-10-CM | POA: Diagnosis not present

## 2022-05-04 DIAGNOSIS — R6883 Chills (without fever): Secondary | ICD-10-CM | POA: Diagnosis not present

## 2022-05-08 DIAGNOSIS — J301 Allergic rhinitis due to pollen: Secondary | ICD-10-CM | POA: Diagnosis not present

## 2022-05-08 DIAGNOSIS — J3089 Other allergic rhinitis: Secondary | ICD-10-CM | POA: Diagnosis not present

## 2022-05-08 DIAGNOSIS — J3081 Allergic rhinitis due to animal (cat) (dog) hair and dander: Secondary | ICD-10-CM | POA: Diagnosis not present

## 2022-05-11 DIAGNOSIS — G580 Intercostal neuropathy: Secondary | ICD-10-CM | POA: Diagnosis not present

## 2022-05-16 DIAGNOSIS — J3081 Allergic rhinitis due to animal (cat) (dog) hair and dander: Secondary | ICD-10-CM | POA: Diagnosis not present

## 2022-05-16 DIAGNOSIS — J301 Allergic rhinitis due to pollen: Secondary | ICD-10-CM | POA: Diagnosis not present

## 2022-05-16 DIAGNOSIS — J3089 Other allergic rhinitis: Secondary | ICD-10-CM | POA: Diagnosis not present

## 2022-05-24 DIAGNOSIS — J3081 Allergic rhinitis due to animal (cat) (dog) hair and dander: Secondary | ICD-10-CM | POA: Diagnosis not present

## 2022-05-24 DIAGNOSIS — J301 Allergic rhinitis due to pollen: Secondary | ICD-10-CM | POA: Diagnosis not present

## 2022-05-24 DIAGNOSIS — J3089 Other allergic rhinitis: Secondary | ICD-10-CM | POA: Diagnosis not present

## 2022-05-25 DIAGNOSIS — M5416 Radiculopathy, lumbar region: Secondary | ICD-10-CM | POA: Diagnosis not present

## 2022-05-26 ENCOUNTER — Ambulatory Visit: Payer: Medicare PPO | Admitting: Neurology

## 2022-05-26 DIAGNOSIS — G245 Blepharospasm: Secondary | ICD-10-CM | POA: Diagnosis not present

## 2022-05-26 MED ORDER — ONABOTULINUMTOXINA 100 UNITS IJ SOLR
100.0000 [IU] | Freq: Once | INTRAMUSCULAR | Status: AC
  Administered 2022-05-26: 20 [IU] via INTRAMUSCULAR

## 2022-05-26 NOTE — Procedures (Signed)
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     Consent obtained from: The patient Benefits discussed included, but were not limited to decreased muscle tightness, increased joint range of motion, and decreased pain.  Risk discussed included, but were not limited pain and discomfort, bleeding, increased ptosis, increased facial asymmetry, bruising, excessive weakness, venous thrombosis, muscle atrophy and dysphagia.  A copy of the patient medication guide was given to the patient which explains the blackbox warning.  Patients identity and treatment sites confirmed Yes.  .  Details of Procedure: Skin was cleaned with alcohol.   Prior to injection, the needle plunger was aspirated to make sure the needle was not within a blood vessel.  There was no blood retrieved on aspiration.    Following is a summary of the muscles injected  And the amount of Botulinum toxin used:  Injections  Location Left  Right Units Number of sites        Corrugator      Frontalis      Lower Lid, Lateral 2.5 2.5 5.0 1 each side  Lower Lid Medial 2.5  2.5 1  Upper Lid, Lateral 2.5 2.5 5.0 1 each side  Upper Lid, Medial      Canthus 5.0 2.5 7.5 1 each side  Temporalis      Masseter      Procerus      Zygomaticus Major      TOTAL UNITS:   20    Agent: Botulinum Type A ( Onobotulinum Toxin type A ).  1 vials of Botox were used, each containing 50 units and freshly diluted with 2 mL of sterile, non-perserved saline   Total injected (Units): 20  Total wasted (Units): 80 Pt tolerated procedure well; no complications  Reinjection is anticipated in 3 months. 

## 2022-05-29 DIAGNOSIS — I10 Essential (primary) hypertension: Secondary | ICD-10-CM | POA: Diagnosis not present

## 2022-05-30 DIAGNOSIS — J3081 Allergic rhinitis due to animal (cat) (dog) hair and dander: Secondary | ICD-10-CM | POA: Diagnosis not present

## 2022-05-30 DIAGNOSIS — J3089 Other allergic rhinitis: Secondary | ICD-10-CM | POA: Diagnosis not present

## 2022-05-30 DIAGNOSIS — J301 Allergic rhinitis due to pollen: Secondary | ICD-10-CM | POA: Diagnosis not present

## 2022-06-01 ENCOUNTER — Ambulatory Visit (HOSPITAL_BASED_OUTPATIENT_CLINIC_OR_DEPARTMENT_OTHER): Payer: Medicare PPO | Admitting: Family Medicine

## 2022-06-01 DIAGNOSIS — J301 Allergic rhinitis due to pollen: Secondary | ICD-10-CM | POA: Diagnosis not present

## 2022-06-03 DIAGNOSIS — M5136 Other intervertebral disc degeneration, lumbar region: Secondary | ICD-10-CM | POA: Diagnosis not present

## 2022-06-03 DIAGNOSIS — M5416 Radiculopathy, lumbar region: Secondary | ICD-10-CM | POA: Diagnosis not present

## 2022-06-03 DIAGNOSIS — M5414 Radiculopathy, thoracic region: Secondary | ICD-10-CM | POA: Diagnosis not present

## 2022-06-06 DIAGNOSIS — J3089 Other allergic rhinitis: Secondary | ICD-10-CM | POA: Diagnosis not present

## 2022-06-06 DIAGNOSIS — J301 Allergic rhinitis due to pollen: Secondary | ICD-10-CM | POA: Diagnosis not present

## 2022-06-06 DIAGNOSIS — J3081 Allergic rhinitis due to animal (cat) (dog) hair and dander: Secondary | ICD-10-CM | POA: Diagnosis not present

## 2022-06-14 DIAGNOSIS — J3081 Allergic rhinitis due to animal (cat) (dog) hair and dander: Secondary | ICD-10-CM | POA: Diagnosis not present

## 2022-06-14 DIAGNOSIS — J3089 Other allergic rhinitis: Secondary | ICD-10-CM | POA: Diagnosis not present

## 2022-06-14 DIAGNOSIS — J301 Allergic rhinitis due to pollen: Secondary | ICD-10-CM | POA: Diagnosis not present

## 2022-06-29 DIAGNOSIS — M5414 Radiculopathy, thoracic region: Secondary | ICD-10-CM | POA: Diagnosis not present

## 2022-06-30 DIAGNOSIS — J301 Allergic rhinitis due to pollen: Secondary | ICD-10-CM | POA: Diagnosis not present

## 2022-06-30 DIAGNOSIS — J3081 Allergic rhinitis due to animal (cat) (dog) hair and dander: Secondary | ICD-10-CM | POA: Diagnosis not present

## 2022-06-30 DIAGNOSIS — J3089 Other allergic rhinitis: Secondary | ICD-10-CM | POA: Diagnosis not present

## 2022-07-12 DIAGNOSIS — J3081 Allergic rhinitis due to animal (cat) (dog) hair and dander: Secondary | ICD-10-CM | POA: Diagnosis not present

## 2022-07-12 DIAGNOSIS — J3089 Other allergic rhinitis: Secondary | ICD-10-CM | POA: Diagnosis not present

## 2022-07-13 ENCOUNTER — Ambulatory Visit: Payer: Medicare PPO

## 2022-07-13 ENCOUNTER — Ambulatory Visit: Payer: Medicare PPO | Admitting: Podiatry

## 2022-07-13 DIAGNOSIS — M79672 Pain in left foot: Secondary | ICD-10-CM

## 2022-07-13 DIAGNOSIS — L600 Ingrowing nail: Secondary | ICD-10-CM

## 2022-07-13 MED ORDER — MUPIROCIN 2 % EX OINT: 1.0000 | TOPICAL_OINTMENT | Freq: Two times a day (BID) | CUTANEOUS | 2 refills | Status: DC

## 2022-07-13 NOTE — Progress Notes (Signed)
Subjective:   Patient ID: [name], female   DOB: 71 y.o.   MRN: [id number]   HPI Chief Complaint  Patient presents with   Ingrown Toenail    Left hallux ingrown toenail, having pain off on on for the last 5 years,     The left big toenail came off about 5 years ago . She now gets pain to the medial aspect of the big toenail. She gets pedicures which helps but the it only helps short term. No drainage or fluid coming form it, just soreness. Even a sheet on the toenail hurts on the side at times. She wears shoes that don't squeeze it but it still gets sore.   Review of Systems  All other systems reviewed and are negative.  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   Post Neck - Marked   B12 deficiency    Chronic pain    History of shingles    Hypertension    Osteoarthritis    SCC (squamous cell carcinoma) 03/14/2017   Left Hand - Well Diff    Past Surgical History:  Procedure Laterality Date   ABDOMINAL HYSTERECTOMY     ANKLE SURGERY Right    BOTOX INJECTION     facial   FACIAL COSMETIC SURGERY     FOOT ARTHRODESIS Left 01/22/2020   Procedure: Left talonavicular joint arthrodesis;  Surgeon: Wylene Simmer, MD;  Location: [facility];  Service: Orthopedics;  Laterality: Left;   GASTRIC BYPASS     REPLACEMENT TOTAL KNEE Bilateral      Current Outpatient Medications:    mupirocin ointment (BACTROBAN) 2 %, Apply 1 Application topically 2 (two) times daily., Disp: 30 g, Rfl: 2   azelastine (ASTELIN) 0.1 % nasal spray, Place 1 spray into both nostrils 2 (two) times daily as needed for rhinitis or allergies. , Disp: , Rfl: 5   baclofen (LIORESAL) 10 MG tablet, Take 1 tablet (10 mg total) by mouth daily as needed for muscle spasms., Disp: 40 tablet, Rfl: 0   Calcium Carbonate-Vitamin D (CALCIUM + D PO), Take 1 tablet by mouth every evening. , Disp: , Rfl:    clobetasol (OLUX) 0.05 % topical foam, APPLY TO THE SCALP EVERY DAY AS DIRECTED,  Disp: 100 g, Rfl: 4   cycloSPORINE (RESTASIS) 0.05 % ophthalmic emulsion, Restasis 0.05 % eye drops in a dropperette, Disp: , Rfl:    felodipine (PLENDIL) 5 MG 24 hr tablet, Take 5 mg by mouth daily after breakfast. , Disp: , Rfl:    Ferrous Sulfate (IRON) 28 MG TABS, Take 1 tablet by mouth daily after breakfast. , Disp: , Rfl:    fluticasone (FLONASE) 50 MCG/ACT nasal spray, Place 1 spray into both nostrils 2 (two) times daily. , Disp: , Rfl:    gabapentin (NEURONTIN) 600 MG tablet, Take 600 mg by mouth 2 (two) times daily. , Disp: , Rfl:    levocetirizine (XYZAL) 5 MG tablet, Take 5 mg by mouth daily after breakfast. , Disp: , Rfl: 3   lidocaine (LIDODERM) 5 %, Place 1 patch onto the skin daily. Remove & Discard patch within 12 hours or as directed by MD, Disp: , Rfl:    lisinopril (PRINIVIL,ZESTRIL) 10 MG tablet, Take 10 mg by mouth daily after breakfast. , Disp: , Rfl:    LORazepam (ATIVAN) 0.5 MG tablet, Take 0.5 mg by mouth every 8 (eight) hours as needed for anxiety. , Disp: , Rfl:    montelukast (  SINGULAIR) 10 MG tablet, Take 10 mg by mouth daily after breakfast. , Disp: , Rfl: 3   Multiple Vitamin (MULTIVITAMIN) tablet, Take 1 tablet by mouth daily., Disp: , Rfl:    traMADol (ULTRAM) 50 MG tablet, , Disp: , Rfl:    triamcinolone cream (KENALOG) 0.1 %, Apply 1 application topically daily as needed (rash). , Disp: , Rfl: 6   zolpidem (AMBIEN) 5 MG tablet, Take 5 mg by mouth at bedtime as needed for sleep., Disp: , Rfl:   Allergies  Allergen Reactions   Oxycodone Itching   Penicillins Other (See Comments)    Has had many times, no reaction  Has patient had a PCN reaction causing immediate rash, facial/tongue/throat swelling, SOB or lightheadedness with hypotension: n/a Has patient had a PCN reaction causing severe rash involving mucus membranes or skin necrosis: n/a Has patient had a PCN reaction that required hospitalization: n/a Has patient had a PCN reaction occurring within the  last 10 years: n/a If all of the above answers are "NO", then may proceed with Cephalosporin use.    Sulfa Antibiotics Itching   Lorcet [Hydrocodone-Acetaminophen] Itching            Objective:  Physical Exam  General: AAO x3, NAD  Dermatological: Incurvation present along the left hallux toenail with tenderness palpation there is no edema, erythema, drainage or pus or any signs of infection noted today.  No open lesions.  Vascular: Dorsalis Pedis artery and Posterior Tibial artery pedal pulses are 2/4 bilateral with immedate capillary fill time.  There is no pain with calf compression, swelling, warmth, erythema.   Neruologic: Grossly intact via light touch bilateral.  Musculoskeletal: No gross boney pedal deformities bilateral. No pain, crepitus, or limitation noted with foot and ankle range of motion bilateral. Muscular strength 5/5 in all groups tested bilateral.  Gait: Unassisted, Nonantalgic.       Assessment:   Ingrown toenail left hallux     Plan:  -Treatment options discussed including all alternatives, risks, and complications -Etiology of symptoms were discussed -I discussed partial nail avulsion with chemical matricectomy and she wants to proceed with this but not today.  For now reappoint her for next week.  Discussed the procedure as well as postoperative course and she agrees to this.  We discussed conservative care as well.  For now Epsom salt soaks.  Monitor for any signs or symptoms of infection.  Trula Slade DPM

## 2022-07-13 NOTE — Patient Instructions (Signed)

## 2022-07-18 ENCOUNTER — Ambulatory Visit: Payer: Medicare PPO | Admitting: Podiatry

## 2022-07-18 DIAGNOSIS — J3089 Other allergic rhinitis: Secondary | ICD-10-CM | POA: Diagnosis not present

## 2022-07-18 DIAGNOSIS — J301 Allergic rhinitis due to pollen: Secondary | ICD-10-CM | POA: Diagnosis not present

## 2022-07-18 DIAGNOSIS — J3081 Allergic rhinitis due to animal (cat) (dog) hair and dander: Secondary | ICD-10-CM | POA: Diagnosis not present

## 2022-07-18 DIAGNOSIS — L600 Ingrowing nail: Secondary | ICD-10-CM | POA: Diagnosis not present

## 2022-07-18 NOTE — Patient Instructions (Signed)

## 2022-07-25 NOTE — Progress Notes (Signed)
Subjective: Chief Complaint  Patient presents with   Ingrown Toenail    Ingrown toenail left foot    71 year old female presents with concerns.  She states she is having soreness on the toenails with subsequent removal.  No drainage or pus.  Objective: AAO x3, NAD DP/PT pulses palpable bilaterally, CRT less than 3 seconds Exam unchanged.  There is tenderness palpation left hallux toenail with localized edema and faint erythema more from inflammation as opposed to infection there is no drainage or pus.  There is no ascending cellulitis.  There is no fluctuation or crepitation but there is no malodor.  No signs of an abscess. No pain with calf compression, swelling, warmth, erythema  Assessment: Ingrown toenail left hallux  Plan: -All treatment options discussed with the patient including all alternatives, risks, complications.  -At this time, the patient is requesting partial nail removal with chemical matricectomy to the symptomatic portion of the nail. Risks and complications were discussed with the patient for which they understand and written consent was obtained. Under sterile conditions a total of 3 mL of a mixture of 2% lidocaine plain and 0.5% Marcaine plain was infiltrated in a hallux block fashion. Once anesthetized, the skin was prepped in sterile fashion. A tourniquet was then applied. Next the symptomatic nail border was then sharply excised making sure to remove the entire offending nail border. Once the nails were ensured to be removed area was debrided and the underlying skin was intact. There is no purulence identified in the procedure. Next phenol was then applied under standard conditions and copiously irrigated. Silvadene was applied. A dry sterile dressing was applied. After application of the dressing the tourniquet was removed and there is found to be an immediate capillary refill time to the digit. The patient tolerated the procedure well any complications. Post procedure  instructions were discussed the patient for which he verbally understood. Follow-up in one week for nail check or sooner if any problems are to arise. Discussed signs/symptoms of infection and directed to call the office immediately should any occur or go directly to the emergency room. In the meantime, encouraged to call the office with any questions, concerns, changes symptoms. -Patient encouraged to call the office with any questions, concerns, change in symptoms.   Trula Slade DPM

## 2022-07-28 DIAGNOSIS — J301 Allergic rhinitis due to pollen: Secondary | ICD-10-CM | POA: Diagnosis not present

## 2022-07-28 DIAGNOSIS — J3089 Other allergic rhinitis: Secondary | ICD-10-CM | POA: Diagnosis not present

## 2022-07-28 DIAGNOSIS — J3081 Allergic rhinitis due to animal (cat) (dog) hair and dander: Secondary | ICD-10-CM | POA: Diagnosis not present

## 2022-08-03 DIAGNOSIS — J3089 Other allergic rhinitis: Secondary | ICD-10-CM | POA: Diagnosis not present

## 2022-08-03 DIAGNOSIS — J301 Allergic rhinitis due to pollen: Secondary | ICD-10-CM | POA: Diagnosis not present

## 2022-08-03 DIAGNOSIS — J3081 Allergic rhinitis due to animal (cat) (dog) hair and dander: Secondary | ICD-10-CM | POA: Diagnosis not present

## 2022-08-10 DIAGNOSIS — J3089 Other allergic rhinitis: Secondary | ICD-10-CM | POA: Diagnosis not present

## 2022-08-10 DIAGNOSIS — J3081 Allergic rhinitis due to animal (cat) (dog) hair and dander: Secondary | ICD-10-CM | POA: Diagnosis not present

## 2022-08-10 DIAGNOSIS — J301 Allergic rhinitis due to pollen: Secondary | ICD-10-CM | POA: Diagnosis not present

## 2022-08-17 DIAGNOSIS — J3081 Allergic rhinitis due to animal (cat) (dog) hair and dander: Secondary | ICD-10-CM | POA: Diagnosis not present

## 2022-08-17 DIAGNOSIS — J3089 Other allergic rhinitis: Secondary | ICD-10-CM | POA: Diagnosis not present

## 2022-08-17 DIAGNOSIS — J301 Allergic rhinitis due to pollen: Secondary | ICD-10-CM | POA: Diagnosis not present

## 2022-08-22 DIAGNOSIS — J301 Allergic rhinitis due to pollen: Secondary | ICD-10-CM | POA: Diagnosis not present

## 2022-08-22 DIAGNOSIS — J3081 Allergic rhinitis due to animal (cat) (dog) hair and dander: Secondary | ICD-10-CM | POA: Diagnosis not present

## 2022-08-22 DIAGNOSIS — J3089 Other allergic rhinitis: Secondary | ICD-10-CM | POA: Diagnosis not present

## 2022-08-28 DIAGNOSIS — M545 Low back pain, unspecified: Secondary | ICD-10-CM | POA: Diagnosis not present

## 2022-08-28 DIAGNOSIS — G8929 Other chronic pain: Secondary | ICD-10-CM | POA: Diagnosis not present

## 2022-08-28 DIAGNOSIS — R21 Rash and other nonspecific skin eruption: Secondary | ICD-10-CM | POA: Diagnosis not present

## 2022-08-28 DIAGNOSIS — K219 Gastro-esophageal reflux disease without esophagitis: Secondary | ICD-10-CM | POA: Diagnosis not present

## 2022-08-30 DIAGNOSIS — J3089 Other allergic rhinitis: Secondary | ICD-10-CM | POA: Diagnosis not present

## 2022-08-30 DIAGNOSIS — J301 Allergic rhinitis due to pollen: Secondary | ICD-10-CM | POA: Diagnosis not present

## 2022-08-30 DIAGNOSIS — J3081 Allergic rhinitis due to animal (cat) (dog) hair and dander: Secondary | ICD-10-CM | POA: Diagnosis not present

## 2022-09-01 ENCOUNTER — Ambulatory Visit: Payer: Medicare PPO | Admitting: Neurology

## 2022-09-01 DIAGNOSIS — G245 Blepharospasm: Secondary | ICD-10-CM | POA: Diagnosis not present

## 2022-09-01 MED ORDER — ONABOTULINUMTOXINA 100 UNITS IJ SOLR
100.0000 [IU] | Freq: Once | INTRAMUSCULAR | Status: AC
  Administered 2022-09-01: 20 [IU] via INTRAMUSCULAR

## 2022-09-01 NOTE — Procedures (Signed)
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     Consent obtained from: The patient Benefits discussed included, but were not limited to decreased muscle tightness, increased joint range of motion, and decreased pain.  Risk discussed included, but were not limited pain and discomfort, bleeding, increased ptosis, increased facial asymmetry, bruising, excessive weakness, venous thrombosis, muscle atrophy and dysphagia.  A copy of the patient medication guide was given to the patient which explains the blackbox warning.  Patients identity and treatment sites confirmed Yes.  .  Details of Procedure: Skin was cleaned with alcohol.   Prior to injection, the needle plunger was aspirated to make sure the needle was not within a blood vessel.  There was no blood retrieved on aspiration.    Following is a summary of the muscles injected  And the amount of Botulinum toxin used:  Injections  Location Left  Right Units Number of sites        Corrugator      Frontalis      Lower Lid, Lateral 2.5 2.5 5.0 1 each side  Lower Lid Medial 2.5  2.5 1  Upper Lid, Lateral 2.5 2.5 5.0 1 each side  Upper Lid, Medial      Canthus 5.0 2.5 7.5 1 each side  Temporalis      Masseter      Procerus      Zygomaticus Major      TOTAL UNITS:   20    Agent: Botulinum Type A ( Onobotulinum Toxin type A ).  1 vials of Botox were used, each containing 50 units and freshly diluted with 2 mL of sterile, non-perserved saline   Total injected (Units): 20  Total wasted (Units): 80 Pt tolerated procedure well; no complications  Reinjection is anticipated in 3 months.

## 2022-09-05 DIAGNOSIS — J3081 Allergic rhinitis due to animal (cat) (dog) hair and dander: Secondary | ICD-10-CM | POA: Diagnosis not present

## 2022-09-05 DIAGNOSIS — M5414 Radiculopathy, thoracic region: Secondary | ICD-10-CM | POA: Diagnosis not present

## 2022-09-05 DIAGNOSIS — J301 Allergic rhinitis due to pollen: Secondary | ICD-10-CM | POA: Diagnosis not present

## 2022-09-05 DIAGNOSIS — J3089 Other allergic rhinitis: Secondary | ICD-10-CM | POA: Diagnosis not present

## 2022-09-11 DIAGNOSIS — J3089 Other allergic rhinitis: Secondary | ICD-10-CM | POA: Diagnosis not present

## 2022-09-11 DIAGNOSIS — J3081 Allergic rhinitis due to animal (cat) (dog) hair and dander: Secondary | ICD-10-CM | POA: Diagnosis not present

## 2022-09-11 DIAGNOSIS — J301 Allergic rhinitis due to pollen: Secondary | ICD-10-CM | POA: Diagnosis not present

## 2022-09-19 DIAGNOSIS — J301 Allergic rhinitis due to pollen: Secondary | ICD-10-CM | POA: Diagnosis not present

## 2022-09-19 DIAGNOSIS — J3081 Allergic rhinitis due to animal (cat) (dog) hair and dander: Secondary | ICD-10-CM | POA: Diagnosis not present

## 2022-09-19 DIAGNOSIS — J3089 Other allergic rhinitis: Secondary | ICD-10-CM | POA: Diagnosis not present

## 2022-09-19 DIAGNOSIS — M5416 Radiculopathy, lumbar region: Secondary | ICD-10-CM | POA: Diagnosis not present

## 2022-09-26 DIAGNOSIS — J3081 Allergic rhinitis due to animal (cat) (dog) hair and dander: Secondary | ICD-10-CM | POA: Diagnosis not present

## 2022-09-26 DIAGNOSIS — J3089 Other allergic rhinitis: Secondary | ICD-10-CM | POA: Diagnosis not present

## 2022-09-26 DIAGNOSIS — J301 Allergic rhinitis due to pollen: Secondary | ICD-10-CM | POA: Diagnosis not present

## 2022-11-07 ENCOUNTER — Other Ambulatory Visit (HOSPITAL_BASED_OUTPATIENT_CLINIC_OR_DEPARTMENT_OTHER): Payer: Self-pay

## 2022-11-08 ENCOUNTER — Other Ambulatory Visit: Payer: Self-pay | Admitting: Neurology

## 2022-11-08 ENCOUNTER — Other Ambulatory Visit (HOSPITAL_BASED_OUTPATIENT_CLINIC_OR_DEPARTMENT_OTHER): Payer: Self-pay

## 2022-11-08 DIAGNOSIS — G245 Blepharospasm: Secondary | ICD-10-CM

## 2022-11-08 MED ORDER — FLUTICASONE PROPIONATE 50 MCG/ACT NA SUSP
1.0000 | Freq: Every day | NASAL | 1 refills | Status: DC
  Filled 2022-12-08: qty 48, 90d supply, fill #0

## 2022-11-08 MED ORDER — LISINOPRIL 10 MG PO TABS
10.0000 mg | ORAL_TABLET | Freq: Every day | ORAL | 3 refills | Status: DC
  Filled 2022-11-08 – 2022-12-08 (×2): qty 90, 90d supply, fill #0
  Filled 2023-03-21: qty 90, 90d supply, fill #1

## 2022-11-08 MED ORDER — OMEPRAZOLE 40 MG PO CPDR
40.0000 mg | DELAYED_RELEASE_CAPSULE | ORAL | 1 refills | Status: DC
  Filled 2023-01-18: qty 180, 90d supply, fill #0

## 2022-11-08 MED ORDER — LEVOCETIRIZINE DIHYDROCHLORIDE 5 MG PO TABS
5.0000 mg | ORAL_TABLET | Freq: Every evening | ORAL | 1 refills | Status: DC
  Filled 2022-11-08 – 2022-12-08 (×2): qty 90, 90d supply, fill #0

## 2022-11-08 MED ORDER — FELODIPINE ER 5 MG PO TB24
5.0000 mg | ORAL_TABLET | Freq: Every day | ORAL | 3 refills | Status: DC
  Filled 2022-11-29: qty 90, 90d supply, fill #0

## 2022-11-08 MED ORDER — CYCLOSPORINE 0.05 % OP EMUL
1.0000 [drp] | Freq: Two times a day (BID) | OPHTHALMIC | 11 refills | Status: AC
  Filled 2022-11-29: qty 60, 30d supply, fill #0

## 2022-11-08 MED ORDER — MUPIROCIN 2 % EX OINT: TOPICAL_OINTMENT | CUTANEOUS | 2 refills | Status: DC

## 2022-11-08 MED ORDER — MONTELUKAST SODIUM 10 MG PO TABS
10.0000 mg | ORAL_TABLET | Freq: Every evening | ORAL | 1 refills | Status: DC
  Filled 2022-12-08: qty 90, 90d supply, fill #0

## 2022-11-11 ENCOUNTER — Other Ambulatory Visit (HOSPITAL_BASED_OUTPATIENT_CLINIC_OR_DEPARTMENT_OTHER): Payer: Self-pay

## 2022-11-16 ENCOUNTER — Telehealth: Payer: Self-pay

## 2022-11-16 NOTE — Telephone Encounter (Signed)
Spoke to [place] and set up has been scheduled

## 2022-11-16 NOTE — Telephone Encounter (Signed)
[facility] called patient stating that they have attempted to reach [place] and left voicemail's about scheduling a delivery for her botox. Patient wondered if we can reach out to schedule this with them.

## 2022-11-29 ENCOUNTER — Other Ambulatory Visit (HOSPITAL_BASED_OUTPATIENT_CLINIC_OR_DEPARTMENT_OTHER): Payer: Self-pay

## 2022-11-30 ENCOUNTER — Other Ambulatory Visit: Payer: Self-pay

## 2022-11-30 ENCOUNTER — Other Ambulatory Visit (HOSPITAL_BASED_OUTPATIENT_CLINIC_OR_DEPARTMENT_OTHER): Payer: Self-pay

## 2022-11-30 MED ORDER — LORAZEPAM 0.5 MG PO TABS
0.5000 mg | ORAL_TABLET | Freq: Two times a day (BID) | ORAL | 0 refills | Status: DC | PRN
  Filled 2022-11-30 – 2022-12-02 (×2): qty 60, 30d supply, fill #0

## 2022-11-30 MED ORDER — TRAMADOL HCL 50 MG PO TABS
100.0000 mg | ORAL_TABLET | Freq: Three times a day (TID) | ORAL | 0 refills | Status: DC | PRN
  Filled 2022-11-30: qty 180, 30d supply, fill #0

## 2022-11-30 MED ORDER — ZOLPIDEM TARTRATE 10 MG PO TABS
10.0000 mg | ORAL_TABLET | Freq: Every evening | ORAL | 0 refills | Status: DC | PRN
  Filled 2022-11-30: qty 30, 30d supply, fill #0

## 2022-12-01 ENCOUNTER — Other Ambulatory Visit (HOSPITAL_BASED_OUTPATIENT_CLINIC_OR_DEPARTMENT_OTHER): Payer: Self-pay

## 2022-12-01 ENCOUNTER — Ambulatory Visit: Payer: Medicare PPO | Admitting: Neurology

## 2022-12-01 DIAGNOSIS — G5133 Clonic hemifacial spasm, bilateral: Secondary | ICD-10-CM | POA: Diagnosis not present

## 2022-12-01 DIAGNOSIS — G245 Blepharospasm: Secondary | ICD-10-CM

## 2022-12-01 MED ORDER — ONABOTULINUMTOXINA 100 UNITS IJ SOLR
100.0000 [IU] | Freq: Once | INTRAMUSCULAR | Status: AC
  Administered 2022-12-01: 20 [IU] via INTRAMUSCULAR

## 2022-12-01 NOTE — Procedures (Signed)
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     Consent obtained from: The patient Benefits discussed included, but were not limited to decreased muscle tightness, increased joint range of motion, and decreased pain.  Risk discussed included, but were not limited pain and discomfort, bleeding, increased ptosis, increased facial asymmetry, bruising, excessive weakness, venous thrombosis, muscle atrophy and dysphagia.  A copy of the patient medication guide was given to the patient which explains the blackbox warning.  Patients identity and treatment sites confirmed Yes.  .  Details of Procedure: Skin was cleaned with alcohol.   Prior to injection, the needle plunger was aspirated to make sure the needle was not within a blood vessel.  There was no blood retrieved on aspiration.    Following is a summary of the muscles injected  And the amount of Botulinum toxin used:  Injections  Location Left  Right Units Number of sites        Corrugator      Frontalis      Lower Lid, Lateral 2.5 2.5 5.0 1 each side  Lower Lid Medial 2.5  2.5 1  Upper Lid, Lateral 2.5 2.5 5.0 1 each side  Upper Lid, Medial      Canthus 5.0 2.5 7.5 1 each side  Temporalis      Masseter      Procerus      Zygomaticus Major      TOTAL UNITS:   20    Agent: Botulinum Type A ( Onobotulinum Toxin type A ).  1 vials of Botox were used, each containing 50 units and freshly diluted with 2 mL of sterile, non-perserved saline   Total injected (Units): 20  Total wasted (Units): 80 Pt tolerated procedure well; small area of ecchymosis L upper lid; no complications Reinjection is anticipated in 3 months.

## 2022-12-02 ENCOUNTER — Other Ambulatory Visit (HOSPITAL_BASED_OUTPATIENT_CLINIC_OR_DEPARTMENT_OTHER): Payer: Self-pay

## 2022-12-05 DIAGNOSIS — M5414 Radiculopathy, thoracic region: Secondary | ICD-10-CM | POA: Diagnosis not present

## 2022-12-06 DIAGNOSIS — J3089 Other allergic rhinitis: Secondary | ICD-10-CM | POA: Diagnosis not present

## 2022-12-06 DIAGNOSIS — J301 Allergic rhinitis due to pollen: Secondary | ICD-10-CM | POA: Diagnosis not present

## 2022-12-06 DIAGNOSIS — J3081 Allergic rhinitis due to animal (cat) (dog) hair and dander: Secondary | ICD-10-CM | POA: Diagnosis not present

## 2022-12-08 ENCOUNTER — Other Ambulatory Visit (HOSPITAL_BASED_OUTPATIENT_CLINIC_OR_DEPARTMENT_OTHER): Payer: Self-pay

## 2022-12-11 DIAGNOSIS — J3089 Other allergic rhinitis: Secondary | ICD-10-CM | POA: Diagnosis not present

## 2022-12-11 DIAGNOSIS — G514 Facial myokymia: Secondary | ICD-10-CM | POA: Diagnosis not present

## 2022-12-11 DIAGNOSIS — J3081 Allergic rhinitis due to animal (cat) (dog) hair and dander: Secondary | ICD-10-CM | POA: Diagnosis not present

## 2022-12-11 DIAGNOSIS — H43813 Vitreous degeneration, bilateral: Secondary | ICD-10-CM | POA: Diagnosis not present

## 2022-12-11 DIAGNOSIS — J301 Allergic rhinitis due to pollen: Secondary | ICD-10-CM | POA: Diagnosis not present

## 2022-12-11 DIAGNOSIS — H2513 Age-related nuclear cataract, bilateral: Secondary | ICD-10-CM | POA: Diagnosis not present

## 2022-12-12 ENCOUNTER — Other Ambulatory Visit (HOSPITAL_BASED_OUTPATIENT_CLINIC_OR_DEPARTMENT_OTHER): Payer: Self-pay

## 2022-12-13 DIAGNOSIS — M85851 Other specified disorders of bone density and structure, right thigh: Secondary | ICD-10-CM | POA: Diagnosis not present

## 2022-12-13 DIAGNOSIS — M85852 Other specified disorders of bone density and structure, left thigh: Secondary | ICD-10-CM | POA: Diagnosis not present

## 2022-12-13 DIAGNOSIS — Z780 Asymptomatic menopausal state: Secondary | ICD-10-CM | POA: Diagnosis not present

## 2022-12-18 DIAGNOSIS — J3089 Other allergic rhinitis: Secondary | ICD-10-CM | POA: Diagnosis not present

## 2022-12-18 DIAGNOSIS — J301 Allergic rhinitis due to pollen: Secondary | ICD-10-CM | POA: Diagnosis not present

## 2022-12-18 DIAGNOSIS — J3081 Allergic rhinitis due to animal (cat) (dog) hair and dander: Secondary | ICD-10-CM | POA: Diagnosis not present

## 2022-12-19 DIAGNOSIS — M5416 Radiculopathy, lumbar region: Secondary | ICD-10-CM | POA: Diagnosis not present

## 2022-12-21 ENCOUNTER — Other Ambulatory Visit (HOSPITAL_BASED_OUTPATIENT_CLINIC_OR_DEPARTMENT_OTHER): Payer: Self-pay

## 2022-12-21 MED ORDER — LIDOCAINE 5 % EX PTCH
MEDICATED_PATCH | CUTANEOUS | 5 refills | Status: DC
  Filled 2022-12-21: qty 30, 30d supply, fill #0
  Filled 2023-01-18: qty 30, 30d supply, fill #1
  Filled 2023-02-20: qty 30, 30d supply, fill #2
  Filled 2023-03-21: qty 30, 30d supply, fill #3
  Filled 2023-05-20: qty 30, 30d supply, fill #4
  Filled 2023-06-20: qty 30, 30d supply, fill #5

## 2022-12-30 ENCOUNTER — Other Ambulatory Visit (HOSPITAL_BASED_OUTPATIENT_CLINIC_OR_DEPARTMENT_OTHER): Payer: Self-pay

## 2022-12-31 ENCOUNTER — Other Ambulatory Visit (HOSPITAL_BASED_OUTPATIENT_CLINIC_OR_DEPARTMENT_OTHER): Payer: Self-pay

## 2022-12-31 MED ORDER — TRAMADOL HCL 50 MG PO TABS
100.0000 mg | ORAL_TABLET | Freq: Three times a day (TID) | ORAL | 0 refills | Status: DC | PRN
  Filled 2022-12-31: qty 180, 30d supply, fill #0

## 2022-12-31 MED ORDER — LORAZEPAM 0.5 MG PO TABS
0.5000 mg | ORAL_TABLET | Freq: Two times a day (BID) | ORAL | 0 refills | Status: DC | PRN
  Filled 2022-12-31: qty 60, 30d supply, fill #0

## 2022-12-31 MED ORDER — ZOLPIDEM TARTRATE 10 MG PO TABS
10.0000 mg | ORAL_TABLET | Freq: Every evening | ORAL | 0 refills | Status: DC | PRN
  Filled 2022-12-31: qty 30, 30d supply, fill #0

## 2023-01-01 ENCOUNTER — Other Ambulatory Visit: Payer: Self-pay

## 2023-01-02 ENCOUNTER — Other Ambulatory Visit (HOSPITAL_BASED_OUTPATIENT_CLINIC_OR_DEPARTMENT_OTHER): Payer: Self-pay

## 2023-01-02 DIAGNOSIS — J3081 Allergic rhinitis due to animal (cat) (dog) hair and dander: Secondary | ICD-10-CM | POA: Diagnosis not present

## 2023-01-02 DIAGNOSIS — J3089 Other allergic rhinitis: Secondary | ICD-10-CM | POA: Diagnosis not present

## 2023-01-02 DIAGNOSIS — J301 Allergic rhinitis due to pollen: Secondary | ICD-10-CM | POA: Diagnosis not present

## 2023-01-02 MED ORDER — MELOXICAM 15 MG PO TABS
15.0000 mg | ORAL_TABLET | Freq: Two times a day (BID) | ORAL | 3 refills | Status: DC
  Filled 2023-01-02: qty 142, 71d supply, fill #0
  Filled 2023-01-02: qty 38, 19d supply, fill #0
  Filled 2023-03-21: qty 180, 90d supply, fill #1
  Filled 2023-07-16: qty 180, 90d supply, fill #2
  Filled 2023-10-10: qty 180, 90d supply, fill #3

## 2023-01-03 ENCOUNTER — Other Ambulatory Visit (HOSPITAL_BASED_OUTPATIENT_CLINIC_OR_DEPARTMENT_OTHER): Payer: Self-pay

## 2023-01-09 DIAGNOSIS — J3081 Allergic rhinitis due to animal (cat) (dog) hair and dander: Secondary | ICD-10-CM | POA: Diagnosis not present

## 2023-01-09 DIAGNOSIS — J3089 Other allergic rhinitis: Secondary | ICD-10-CM | POA: Diagnosis not present

## 2023-01-09 DIAGNOSIS — J301 Allergic rhinitis due to pollen: Secondary | ICD-10-CM | POA: Diagnosis not present

## 2023-01-15 DIAGNOSIS — J3089 Other allergic rhinitis: Secondary | ICD-10-CM | POA: Diagnosis not present

## 2023-01-15 DIAGNOSIS — J301 Allergic rhinitis due to pollen: Secondary | ICD-10-CM | POA: Diagnosis not present

## 2023-01-15 DIAGNOSIS — J3081 Allergic rhinitis due to animal (cat) (dog) hair and dander: Secondary | ICD-10-CM | POA: Diagnosis not present

## 2023-01-16 DIAGNOSIS — J301 Allergic rhinitis due to pollen: Secondary | ICD-10-CM | POA: Diagnosis not present

## 2023-01-18 ENCOUNTER — Other Ambulatory Visit (HOSPITAL_BASED_OUTPATIENT_CLINIC_OR_DEPARTMENT_OTHER): Payer: Self-pay

## 2023-01-23 DIAGNOSIS — G580 Intercostal neuropathy: Secondary | ICD-10-CM | POA: Diagnosis not present

## 2023-01-25 DIAGNOSIS — J3089 Other allergic rhinitis: Secondary | ICD-10-CM | POA: Diagnosis not present

## 2023-01-25 DIAGNOSIS — J3081 Allergic rhinitis due to animal (cat) (dog) hair and dander: Secondary | ICD-10-CM | POA: Diagnosis not present

## 2023-01-25 DIAGNOSIS — J301 Allergic rhinitis due to pollen: Secondary | ICD-10-CM | POA: Diagnosis not present

## 2023-02-01 ENCOUNTER — Other Ambulatory Visit (HOSPITAL_BASED_OUTPATIENT_CLINIC_OR_DEPARTMENT_OTHER): Payer: Self-pay

## 2023-02-02 ENCOUNTER — Other Ambulatory Visit (HOSPITAL_BASED_OUTPATIENT_CLINIC_OR_DEPARTMENT_OTHER): Payer: Self-pay

## 2023-02-02 MED ORDER — ZOLPIDEM TARTRATE 10 MG PO TABS
10.0000 mg | ORAL_TABLET | Freq: Every evening | ORAL | 0 refills | Status: DC | PRN
  Filled 2023-02-02: qty 30, 30d supply, fill #0

## 2023-02-02 MED ORDER — TRAMADOL HCL 50 MG PO TABS
100.0000 mg | ORAL_TABLET | Freq: Three times a day (TID) | ORAL | 0 refills | Status: DC | PRN
  Filled 2023-02-02: qty 180, 30d supply, fill #0

## 2023-02-02 MED ORDER — LORAZEPAM 0.5 MG PO TABS
0.5000 mg | ORAL_TABLET | Freq: Two times a day (BID) | ORAL | 0 refills | Status: DC
  Filled 2023-02-02: qty 60, 30d supply, fill #0

## 2023-02-04 ENCOUNTER — Other Ambulatory Visit: Payer: Self-pay | Admitting: Neurology

## 2023-02-04 DIAGNOSIS — G5133 Clonic hemifacial spasm, bilateral: Secondary | ICD-10-CM

## 2023-02-04 DIAGNOSIS — G245 Blepharospasm: Secondary | ICD-10-CM

## 2023-02-06 DIAGNOSIS — J3081 Allergic rhinitis due to animal (cat) (dog) hair and dander: Secondary | ICD-10-CM | POA: Diagnosis not present

## 2023-02-06 DIAGNOSIS — J301 Allergic rhinitis due to pollen: Secondary | ICD-10-CM | POA: Diagnosis not present

## 2023-02-06 DIAGNOSIS — J3089 Other allergic rhinitis: Secondary | ICD-10-CM | POA: Diagnosis not present

## 2023-02-15 DIAGNOSIS — J3081 Allergic rhinitis due to animal (cat) (dog) hair and dander: Secondary | ICD-10-CM | POA: Diagnosis not present

## 2023-02-15 DIAGNOSIS — J301 Allergic rhinitis due to pollen: Secondary | ICD-10-CM | POA: Diagnosis not present

## 2023-02-15 DIAGNOSIS — J3089 Other allergic rhinitis: Secondary | ICD-10-CM | POA: Diagnosis not present

## 2023-02-20 ENCOUNTER — Other Ambulatory Visit (HOSPITAL_BASED_OUTPATIENT_CLINIC_OR_DEPARTMENT_OTHER): Payer: Self-pay

## 2023-02-20 ENCOUNTER — Other Ambulatory Visit: Payer: Self-pay

## 2023-02-20 MED ORDER — FLUTICASONE PROPIONATE 50 MCG/ACT NA SUSP
NASAL | 6 refills | Status: DC
  Filled 2023-02-24 (×2): qty 48, 90d supply, fill #0
  Filled 2023-05-20: qty 48, 90d supply, fill #1
  Filled 2023-08-08 – 2023-08-09 (×2): qty 48, 90d supply, fill #2
  Filled 2023-10-24: qty 48, 90d supply, fill #3
  Filled 2024-01-22: qty 48, 90d supply, fill #4

## 2023-02-22 ENCOUNTER — Other Ambulatory Visit (HOSPITAL_BASED_OUTPATIENT_CLINIC_OR_DEPARTMENT_OTHER): Payer: Self-pay

## 2023-02-22 DIAGNOSIS — J3089 Other allergic rhinitis: Secondary | ICD-10-CM | POA: Diagnosis not present

## 2023-02-22 DIAGNOSIS — J3081 Allergic rhinitis due to animal (cat) (dog) hair and dander: Secondary | ICD-10-CM | POA: Diagnosis not present

## 2023-02-22 DIAGNOSIS — J301 Allergic rhinitis due to pollen: Secondary | ICD-10-CM | POA: Diagnosis not present

## 2023-02-22 MED ORDER — GABAPENTIN 600 MG PO TABS
ORAL_TABLET | ORAL | 3 refills | Status: DC
  Filled 2023-02-22: qty 270, 90d supply, fill #0
  Filled 2023-03-21 – 2023-05-20 (×2): qty 270, 90d supply, fill #1
  Filled 2023-08-19: qty 270, 90d supply, fill #2
  Filled 2023-11-18: qty 270, 90d supply, fill #3

## 2023-02-24 ENCOUNTER — Other Ambulatory Visit (HOSPITAL_BASED_OUTPATIENT_CLINIC_OR_DEPARTMENT_OTHER): Payer: Self-pay

## 2023-02-27 ENCOUNTER — Other Ambulatory Visit (HOSPITAL_BASED_OUTPATIENT_CLINIC_OR_DEPARTMENT_OTHER): Payer: Self-pay

## 2023-02-28 DIAGNOSIS — J3081 Allergic rhinitis due to animal (cat) (dog) hair and dander: Secondary | ICD-10-CM | POA: Diagnosis not present

## 2023-02-28 DIAGNOSIS — J301 Allergic rhinitis due to pollen: Secondary | ICD-10-CM | POA: Diagnosis not present

## 2023-02-28 DIAGNOSIS — J3089 Other allergic rhinitis: Secondary | ICD-10-CM | POA: Diagnosis not present

## 2023-03-01 ENCOUNTER — Other Ambulatory Visit (HOSPITAL_BASED_OUTPATIENT_CLINIC_OR_DEPARTMENT_OTHER): Payer: Self-pay

## 2023-03-01 MED ORDER — CLOBETASOL PROPIONATE 0.05 % EX FOAM
CUTANEOUS | 0 refills | Status: DC
  Filled 2023-03-01: qty 100, 30d supply, fill #0

## 2023-03-02 ENCOUNTER — Ambulatory Visit: Payer: Medicare PPO | Admitting: Neurology

## 2023-03-06 ENCOUNTER — Other Ambulatory Visit (HOSPITAL_BASED_OUTPATIENT_CLINIC_OR_DEPARTMENT_OTHER): Payer: Self-pay

## 2023-03-07 ENCOUNTER — Other Ambulatory Visit (HOSPITAL_BASED_OUTPATIENT_CLINIC_OR_DEPARTMENT_OTHER): Payer: Self-pay

## 2023-03-07 MED ORDER — TRAMADOL HCL 50 MG PO TABS
100.0000 mg | ORAL_TABLET | Freq: Three times a day (TID) | ORAL | 0 refills | Status: DC | PRN
  Filled 2023-03-07: qty 180, 30d supply, fill #0

## 2023-03-07 MED ORDER — LORAZEPAM 0.5 MG PO TABS
0.5000 mg | ORAL_TABLET | Freq: Two times a day (BID) | ORAL | 0 refills | Status: DC | PRN
  Filled 2023-03-07: qty 60, 30d supply, fill #0

## 2023-03-07 MED ORDER — ZOLPIDEM TARTRATE 10 MG PO TABS
10.0000 mg | ORAL_TABLET | Freq: Every evening | ORAL | 0 refills | Status: DC | PRN
  Filled 2023-03-07: qty 30, 30d supply, fill #0

## 2023-03-09 DIAGNOSIS — J3089 Other allergic rhinitis: Secondary | ICD-10-CM | POA: Diagnosis not present

## 2023-03-09 DIAGNOSIS — J301 Allergic rhinitis due to pollen: Secondary | ICD-10-CM | POA: Diagnosis not present

## 2023-03-09 DIAGNOSIS — J3081 Allergic rhinitis due to animal (cat) (dog) hair and dander: Secondary | ICD-10-CM | POA: Diagnosis not present

## 2023-03-16 ENCOUNTER — Ambulatory Visit: Payer: Medicare PPO | Admitting: Neurology

## 2023-03-16 DIAGNOSIS — G245 Blepharospasm: Secondary | ICD-10-CM

## 2023-03-16 MED ORDER — ONABOTULINUMTOXINA 100 UNITS IJ SOLR
100.0000 [IU] | Freq: Once | INTRAMUSCULAR | Status: AC
Start: 2023-03-16 — End: 2023-03-16
  Administered 2023-03-16: 20 [IU] via INTRAMUSCULAR

## 2023-03-16 NOTE — Progress Notes (Signed)
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  Had to r/s injections and was 2 weeks late and noted wearing off a little   Consent obtained from: The patient Benefits discussed included, but were not limited to decreased muscle tightness, increased joint range of motion, and decreased pain.  Risk discussed included, but were not limited pain and discomfort, bleeding, increased ptosis, increased facial asymmetry, bruising, excessive weakness, venous thrombosis, muscle atrophy and dysphagia.  A copy of the patient medication guide was given to the patient which explains the blackbox warning.  Patients identity and treatment sites confirmed Yes.  .  Details of Procedure: Skin was cleaned with alcohol.   Prior to injection, the needle plunger was aspirated to make sure the needle was not within a blood vessel.  There was no blood retrieved on aspiration.    Following is a summary of the muscles injected  And the amount of Botulinum toxin used:  Injections  Location Left  Right Units Number of sites        Corrugator      Frontalis      Lower Lid, Lateral 2.5 2.5 5.0 1 each side  Lower Lid Medial 2.5  2.5 1  Upper Lid, Lateral 2.5 2.5 5.0 1 each side  Upper Lid, Medial      Canthus 5.0 2.5 7.5 1 each side  Temporalis      Masseter      Procerus      Zygomaticus Major      TOTAL UNITS:   20    Agent: Botulinum Type A ( Onobotulinum Toxin type A ).  1 vials of Botox were used, each containing 50 units and freshly diluted with 2 mL of sterile, non-perserved saline   Total injected (Units): 20  Total wasted (Units): 80 Pt tolerated procedure well; no complications Reinjection is anticipated in 3 months.

## 2023-03-21 ENCOUNTER — Other Ambulatory Visit (HOSPITAL_BASED_OUTPATIENT_CLINIC_OR_DEPARTMENT_OTHER): Payer: Self-pay

## 2023-03-21 ENCOUNTER — Other Ambulatory Visit: Payer: Self-pay

## 2023-03-21 DIAGNOSIS — J3089 Other allergic rhinitis: Secondary | ICD-10-CM | POA: Diagnosis not present

## 2023-03-21 DIAGNOSIS — J301 Allergic rhinitis due to pollen: Secondary | ICD-10-CM | POA: Diagnosis not present

## 2023-03-21 DIAGNOSIS — J3081 Allergic rhinitis due to animal (cat) (dog) hair and dander: Secondary | ICD-10-CM | POA: Diagnosis not present

## 2023-03-21 MED ORDER — LEVOCETIRIZINE DIHYDROCHLORIDE 5 MG PO TABS
5.0000 mg | ORAL_TABLET | Freq: Every evening | ORAL | 1 refills | Status: DC
  Filled 2023-03-21: qty 90, 90d supply, fill #0
  Filled 2023-06-20: qty 90, 90d supply, fill #1

## 2023-03-21 MED ORDER — MONTELUKAST SODIUM 10 MG PO TABS
ORAL_TABLET | ORAL | 1 refills | Status: DC
  Filled 2023-03-21: qty 90, 90d supply, fill #0
  Filled 2023-06-20: qty 90, 90d supply, fill #1

## 2023-03-22 ENCOUNTER — Other Ambulatory Visit (HOSPITAL_BASED_OUTPATIENT_CLINIC_OR_DEPARTMENT_OTHER): Payer: Self-pay

## 2023-03-22 ENCOUNTER — Encounter (HOSPITAL_BASED_OUTPATIENT_CLINIC_OR_DEPARTMENT_OTHER): Payer: Self-pay | Admitting: Pharmacist

## 2023-03-22 MED ORDER — FELODIPINE ER 5 MG PO TB24
5.0000 mg | ORAL_TABLET | Freq: Every day | ORAL | 3 refills | Status: DC
  Filled 2023-03-22: qty 90, 90d supply, fill #0
  Filled 2023-06-11 – 2023-06-12 (×2): qty 90, 90d supply, fill #1
  Filled 2023-09-17: qty 90, 90d supply, fill #2
  Filled 2023-12-11: qty 90, 90d supply, fill #3

## 2023-03-23 ENCOUNTER — Other Ambulatory Visit (HOSPITAL_BASED_OUTPATIENT_CLINIC_OR_DEPARTMENT_OTHER): Payer: Self-pay

## 2023-03-26 DIAGNOSIS — J3081 Allergic rhinitis due to animal (cat) (dog) hair and dander: Secondary | ICD-10-CM | POA: Diagnosis not present

## 2023-03-26 DIAGNOSIS — J301 Allergic rhinitis due to pollen: Secondary | ICD-10-CM | POA: Diagnosis not present

## 2023-03-26 DIAGNOSIS — J3089 Other allergic rhinitis: Secondary | ICD-10-CM | POA: Diagnosis not present

## 2023-04-08 ENCOUNTER — Other Ambulatory Visit (HOSPITAL_BASED_OUTPATIENT_CLINIC_OR_DEPARTMENT_OTHER): Payer: Self-pay

## 2023-04-10 ENCOUNTER — Other Ambulatory Visit (HOSPITAL_BASED_OUTPATIENT_CLINIC_OR_DEPARTMENT_OTHER): Payer: Self-pay

## 2023-04-10 MED ORDER — LORAZEPAM 0.5 MG PO TABS
0.5000 mg | ORAL_TABLET | Freq: Two times a day (BID) | ORAL | 0 refills | Status: DC | PRN
  Filled 2023-04-10: qty 60, 30d supply, fill #0

## 2023-04-10 MED ORDER — TRAMADOL HCL 50 MG PO TABS
100.0000 mg | ORAL_TABLET | Freq: Three times a day (TID) | ORAL | 0 refills | Status: DC | PRN
  Filled 2023-04-10: qty 180, 30d supply, fill #0

## 2023-04-16 DIAGNOSIS — J3081 Allergic rhinitis due to animal (cat) (dog) hair and dander: Secondary | ICD-10-CM | POA: Diagnosis not present

## 2023-04-16 DIAGNOSIS — J301 Allergic rhinitis due to pollen: Secondary | ICD-10-CM | POA: Diagnosis not present

## 2023-04-16 DIAGNOSIS — J3089 Other allergic rhinitis: Secondary | ICD-10-CM | POA: Diagnosis not present

## 2023-04-17 DIAGNOSIS — M5414 Radiculopathy, thoracic region: Secondary | ICD-10-CM | POA: Diagnosis not present

## 2023-04-24 DIAGNOSIS — L438 Other lichen planus: Secondary | ICD-10-CM | POA: Diagnosis not present

## 2023-05-01 DIAGNOSIS — J3081 Allergic rhinitis due to animal (cat) (dog) hair and dander: Secondary | ICD-10-CM | POA: Diagnosis not present

## 2023-05-01 DIAGNOSIS — J3089 Other allergic rhinitis: Secondary | ICD-10-CM | POA: Diagnosis not present

## 2023-05-01 DIAGNOSIS — M5416 Radiculopathy, lumbar region: Secondary | ICD-10-CM | POA: Diagnosis not present

## 2023-05-01 DIAGNOSIS — J301 Allergic rhinitis due to pollen: Secondary | ICD-10-CM | POA: Diagnosis not present

## 2023-05-10 ENCOUNTER — Other Ambulatory Visit (HOSPITAL_BASED_OUTPATIENT_CLINIC_OR_DEPARTMENT_OTHER): Payer: Self-pay

## 2023-05-10 DIAGNOSIS — M545 Low back pain, unspecified: Secondary | ICD-10-CM | POA: Diagnosis not present

## 2023-05-11 ENCOUNTER — Other Ambulatory Visit (HOSPITAL_BASED_OUTPATIENT_CLINIC_OR_DEPARTMENT_OTHER): Payer: Self-pay

## 2023-05-11 MED ORDER — LORAZEPAM 0.5 MG PO TABS
0.5000 mg | ORAL_TABLET | Freq: Two times a day (BID) | ORAL | 0 refills | Status: DC
  Filled 2023-05-11: qty 60, 30d supply, fill #0

## 2023-05-11 MED ORDER — TRAMADOL HCL 50 MG PO TABS
100.0000 mg | ORAL_TABLET | Freq: Three times a day (TID) | ORAL | 0 refills | Status: DC
  Filled 2023-05-11: qty 180, 30d supply, fill #0

## 2023-05-15 DIAGNOSIS — J301 Allergic rhinitis due to pollen: Secondary | ICD-10-CM | POA: Diagnosis not present

## 2023-05-15 DIAGNOSIS — J3089 Other allergic rhinitis: Secondary | ICD-10-CM | POA: Diagnosis not present

## 2023-05-15 DIAGNOSIS — J3081 Allergic rhinitis due to animal (cat) (dog) hair and dander: Secondary | ICD-10-CM | POA: Diagnosis not present

## 2023-05-20 ENCOUNTER — Other Ambulatory Visit (HOSPITAL_BASED_OUTPATIENT_CLINIC_OR_DEPARTMENT_OTHER): Payer: Self-pay

## 2023-05-20 MED ORDER — OMEPRAZOLE 40 MG PO CPDR
40.0000 mg | DELAYED_RELEASE_CAPSULE | Freq: Every morning | ORAL | 1 refills | Status: DC
  Filled 2023-05-20: qty 180, 90d supply, fill #0
  Filled 2023-08-19: qty 180, 90d supply, fill #1

## 2023-05-21 ENCOUNTER — Other Ambulatory Visit: Payer: Self-pay

## 2023-05-25 DIAGNOSIS — J3089 Other allergic rhinitis: Secondary | ICD-10-CM | POA: Diagnosis not present

## 2023-05-25 DIAGNOSIS — J301 Allergic rhinitis due to pollen: Secondary | ICD-10-CM | POA: Diagnosis not present

## 2023-05-25 DIAGNOSIS — J3081 Allergic rhinitis due to animal (cat) (dog) hair and dander: Secondary | ICD-10-CM | POA: Diagnosis not present

## 2023-05-26 ENCOUNTER — Other Ambulatory Visit (HOSPITAL_BASED_OUTPATIENT_CLINIC_OR_DEPARTMENT_OTHER): Payer: Self-pay

## 2023-05-29 DIAGNOSIS — J3081 Allergic rhinitis due to animal (cat) (dog) hair and dander: Secondary | ICD-10-CM | POA: Diagnosis not present

## 2023-05-29 DIAGNOSIS — J3089 Other allergic rhinitis: Secondary | ICD-10-CM | POA: Diagnosis not present

## 2023-05-29 DIAGNOSIS — J301 Allergic rhinitis due to pollen: Secondary | ICD-10-CM | POA: Diagnosis not present

## 2023-05-30 DIAGNOSIS — F411 Generalized anxiety disorder: Secondary | ICD-10-CM | POA: Diagnosis not present

## 2023-05-30 DIAGNOSIS — I10 Essential (primary) hypertension: Secondary | ICD-10-CM | POA: Diagnosis not present

## 2023-05-30 DIAGNOSIS — K219 Gastro-esophageal reflux disease without esophagitis: Secondary | ICD-10-CM | POA: Diagnosis not present

## 2023-05-30 DIAGNOSIS — R1012 Left upper quadrant pain: Secondary | ICD-10-CM | POA: Diagnosis not present

## 2023-06-11 ENCOUNTER — Other Ambulatory Visit (HOSPITAL_BASED_OUTPATIENT_CLINIC_OR_DEPARTMENT_OTHER): Payer: Self-pay

## 2023-06-11 MED ORDER — LORAZEPAM 0.5 MG PO TABS
0.5000 mg | ORAL_TABLET | Freq: Two times a day (BID) | ORAL | 2 refills | Status: DC | PRN
  Filled 2023-06-11: qty 60, 30d supply, fill #0
  Filled 2023-07-12: qty 60, 30d supply, fill #1
  Filled 2023-08-08 – 2023-08-09 (×2): qty 60, 30d supply, fill #2

## 2023-06-11 MED ORDER — TRAMADOL HCL 50 MG PO TABS
100.0000 mg | ORAL_TABLET | Freq: Three times a day (TID) | ORAL | 0 refills | Status: DC | PRN
  Filled 2023-06-11: qty 180, 30d supply, fill #0

## 2023-06-12 ENCOUNTER — Other Ambulatory Visit: Payer: Self-pay

## 2023-06-12 ENCOUNTER — Other Ambulatory Visit (HOSPITAL_BASED_OUTPATIENT_CLINIC_OR_DEPARTMENT_OTHER): Payer: Self-pay

## 2023-06-12 DIAGNOSIS — J3089 Other allergic rhinitis: Secondary | ICD-10-CM | POA: Diagnosis not present

## 2023-06-12 DIAGNOSIS — J301 Allergic rhinitis due to pollen: Secondary | ICD-10-CM | POA: Diagnosis not present

## 2023-06-12 DIAGNOSIS — J3081 Allergic rhinitis due to animal (cat) (dog) hair and dander: Secondary | ICD-10-CM | POA: Diagnosis not present

## 2023-06-13 ENCOUNTER — Other Ambulatory Visit (HOSPITAL_BASED_OUTPATIENT_CLINIC_OR_DEPARTMENT_OTHER): Payer: Self-pay

## 2023-06-15 ENCOUNTER — Ambulatory Visit: Payer: Medicare PPO | Admitting: Neurology

## 2023-06-19 DIAGNOSIS — J301 Allergic rhinitis due to pollen: Secondary | ICD-10-CM | POA: Diagnosis not present

## 2023-06-19 DIAGNOSIS — J3081 Allergic rhinitis due to animal (cat) (dog) hair and dander: Secondary | ICD-10-CM | POA: Diagnosis not present

## 2023-06-19 DIAGNOSIS — J3089 Other allergic rhinitis: Secondary | ICD-10-CM | POA: Diagnosis not present

## 2023-06-20 ENCOUNTER — Other Ambulatory Visit: Payer: Self-pay

## 2023-06-20 ENCOUNTER — Other Ambulatory Visit (HOSPITAL_BASED_OUTPATIENT_CLINIC_OR_DEPARTMENT_OTHER): Payer: Self-pay

## 2023-07-04 DIAGNOSIS — J3089 Other allergic rhinitis: Secondary | ICD-10-CM | POA: Diagnosis not present

## 2023-07-04 DIAGNOSIS — J3081 Allergic rhinitis due to animal (cat) (dog) hair and dander: Secondary | ICD-10-CM | POA: Diagnosis not present

## 2023-07-04 DIAGNOSIS — J301 Allergic rhinitis due to pollen: Secondary | ICD-10-CM | POA: Diagnosis not present

## 2023-07-06 ENCOUNTER — Ambulatory Visit: Payer: Medicare PPO | Admitting: Neurology

## 2023-07-06 DIAGNOSIS — R1012 Left upper quadrant pain: Secondary | ICD-10-CM | POA: Diagnosis not present

## 2023-07-06 DIAGNOSIS — G245 Blepharospasm: Secondary | ICD-10-CM

## 2023-07-06 DIAGNOSIS — K8020 Calculus of gallbladder without cholecystitis without obstruction: Secondary | ICD-10-CM | POA: Diagnosis not present

## 2023-07-06 DIAGNOSIS — R109 Unspecified abdominal pain: Secondary | ICD-10-CM | POA: Diagnosis not present

## 2023-07-06 DIAGNOSIS — N281 Cyst of kidney, acquired: Secondary | ICD-10-CM | POA: Diagnosis not present

## 2023-07-06 MED ORDER — ONABOTULINUMTOXINA 100 UNITS IJ SOLR
100.0000 [IU] | Freq: Once | INTRAMUSCULAR | Status: AC
Start: 2023-07-06 — End: 2023-07-06
  Administered 2023-07-06: 20 [IU] via INTRAMUSCULAR

## 2023-07-06 NOTE — Procedures (Signed)
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     Consent obtained from: The patient Benefits discussed included, but were not limited to decreased muscle tightness, increased joint range of motion, and decreased pain.  Risk discussed included, but were not limited pain and discomfort, bleeding, increased ptosis, increased facial asymmetry, bruising, excessive weakness, venous thrombosis, muscle atrophy and dysphagia.  A copy of the patient medication guide was given to the patient which explains the blackbox warning.  Patients identity and treatment sites confirmed Yes.  .  Details of Procedure: Skin was cleaned with alcohol.   Prior to injection, the needle plunger was aspirated to make sure the needle was not within a blood vessel.  There was no blood retrieved on aspiration.    Following is a summary of the muscles injected  And the amount of Botulinum toxin used:  Injections  Location Left  Right Units Number of sites        Corrugator      Frontalis      Lower Lid, Lateral 2.5 2.5 5.0 1 each side  Lower Lid Medial 2.5  2.5 1  Upper Lid, Lateral 2.5 2.5 5.0 1 each side  Upper Lid, Medial      Canthus 5.0 2.5 7.5 1 each side  Temporalis      Masseter      Procerus      Zygomaticus Major      TOTAL UNITS:   20    Agent: Botulinum Type A ( Onobotulinum Toxin type A ).  1 vials of Botox were used, each containing 50 units and freshly diluted with 2 mL of sterile, non-perserved saline   Total injected (Units): 20  Total wasted (Units): 80 Pt tolerated procedure well; no complications  Reinjection is anticipated in 3 months. 

## 2023-07-10 ENCOUNTER — Other Ambulatory Visit: Payer: Self-pay | Admitting: Internal Medicine

## 2023-07-10 DIAGNOSIS — R1084 Generalized abdominal pain: Secondary | ICD-10-CM

## 2023-07-12 ENCOUNTER — Other Ambulatory Visit (HOSPITAL_BASED_OUTPATIENT_CLINIC_OR_DEPARTMENT_OTHER): Payer: Self-pay

## 2023-07-12 DIAGNOSIS — R300 Dysuria: Secondary | ICD-10-CM | POA: Diagnosis not present

## 2023-07-12 MED ORDER — NITROFURANTOIN MONOHYD MACRO 100 MG PO CAPS
100.0000 mg | ORAL_CAPSULE | Freq: Two times a day (BID) | ORAL | 0 refills | Status: AC
  Filled 2023-07-12: qty 20, 10d supply, fill #0

## 2023-07-12 MED ORDER — TRAMADOL HCL 50 MG PO TABS
100.0000 mg | ORAL_TABLET | Freq: Three times a day (TID) | ORAL | 0 refills | Status: DC
  Filled 2023-07-12: qty 180, 30d supply, fill #0

## 2023-07-13 ENCOUNTER — Other Ambulatory Visit (HOSPITAL_BASED_OUTPATIENT_CLINIC_OR_DEPARTMENT_OTHER): Payer: Self-pay

## 2023-07-13 MED ORDER — CLOBETASOL PROPIONATE 0.05 % EX FOAM
CUTANEOUS | 2 refills | Status: DC
  Filled 2023-07-13: qty 50, 30d supply, fill #0
  Filled 2023-08-08: qty 50, 30d supply, fill #1
  Filled 2023-09-17: qty 50, 30d supply, fill #2

## 2023-07-14 ENCOUNTER — Other Ambulatory Visit (HOSPITAL_BASED_OUTPATIENT_CLINIC_OR_DEPARTMENT_OTHER): Payer: Self-pay

## 2023-07-17 ENCOUNTER — Other Ambulatory Visit (HOSPITAL_BASED_OUTPATIENT_CLINIC_OR_DEPARTMENT_OTHER): Payer: Self-pay

## 2023-07-23 ENCOUNTER — Ambulatory Visit
Admission: RE | Admit: 2023-07-23 | Discharge: 2023-07-23 | Disposition: A | Discharge status: Home / Self Care | Payer: Medicare PPO | Source: Ambulatory Visit | Attending: Internal Medicine | Admitting: Internal Medicine

## 2023-07-23 DIAGNOSIS — K8020 Calculus of gallbladder without cholecystitis without obstruction: Secondary | ICD-10-CM | POA: Diagnosis not present

## 2023-07-23 DIAGNOSIS — I700 Atherosclerosis of aorta: Secondary | ICD-10-CM | POA: Diagnosis not present

## 2023-07-23 DIAGNOSIS — R109 Unspecified abdominal pain: Secondary | ICD-10-CM | POA: Diagnosis not present

## 2023-07-23 DIAGNOSIS — K5730 Diverticulosis of large intestine without perforation or abscess without bleeding: Secondary | ICD-10-CM | POA: Diagnosis not present

## 2023-07-23 DIAGNOSIS — R1084 Generalized abdominal pain: Secondary | ICD-10-CM

## 2023-07-23 MED ORDER — IOPAMIDOL (ISOVUE-300) INJECTION 61%
100.0000 mL | Freq: Once | INTRAVENOUS | Status: AC | PRN
  Administered 2023-07-23: 100 mL via INTRAVENOUS

## 2023-07-30 DIAGNOSIS — J3081 Allergic rhinitis due to animal (cat) (dog) hair and dander: Secondary | ICD-10-CM | POA: Diagnosis not present

## 2023-07-30 DIAGNOSIS — J301 Allergic rhinitis due to pollen: Secondary | ICD-10-CM | POA: Diagnosis not present

## 2023-07-30 DIAGNOSIS — J3089 Other allergic rhinitis: Secondary | ICD-10-CM | POA: Diagnosis not present

## 2023-08-01 ENCOUNTER — Other Ambulatory Visit (HOSPITAL_BASED_OUTPATIENT_CLINIC_OR_DEPARTMENT_OTHER): Payer: Self-pay

## 2023-08-01 ENCOUNTER — Other Ambulatory Visit: Payer: Medicare PPO

## 2023-08-07 ENCOUNTER — Other Ambulatory Visit (HOSPITAL_BASED_OUTPATIENT_CLINIC_OR_DEPARTMENT_OTHER): Payer: Self-pay

## 2023-08-07 MED ORDER — COMIRNATY 30 MCG/0.3ML IM SUSY
0.3000 mL | PREFILLED_SYRINGE | Freq: Once | INTRAMUSCULAR | 0 refills | Status: AC
  Filled 2023-08-07: qty 0.3, 1d supply, fill #0

## 2023-08-08 ENCOUNTER — Other Ambulatory Visit (HOSPITAL_BASED_OUTPATIENT_CLINIC_OR_DEPARTMENT_OTHER): Payer: Self-pay

## 2023-08-08 ENCOUNTER — Other Ambulatory Visit: Payer: Self-pay

## 2023-08-08 ENCOUNTER — Other Ambulatory Visit (HOSPITAL_COMMUNITY): Payer: Self-pay

## 2023-08-08 MED ORDER — LIDOCAINE 5 % EX PTCH
1.0000 | MEDICATED_PATCH | Freq: Two times a day (BID) | CUTANEOUS | 5 refills | Status: DC
  Filled 2023-08-08: qty 30, 30d supply, fill #0
  Filled 2023-09-17: qty 30, 30d supply, fill #1
  Filled 2023-11-18: qty 30, 30d supply, fill #2
  Filled 2024-01-20: qty 30, 30d supply, fill #3
  Filled 2024-02-17: qty 30, 30d supply, fill #4
  Filled 2024-03-24: qty 30, 30d supply, fill #5

## 2023-08-08 MED ORDER — TRAMADOL HCL 50 MG PO TABS
100.0000 mg | ORAL_TABLET | Freq: Three times a day (TID) | ORAL | 0 refills | Status: DC
  Filled 2023-08-08 – 2023-08-16 (×4): qty 180, 30d supply, fill #0

## 2023-08-09 ENCOUNTER — Other Ambulatory Visit (HOSPITAL_BASED_OUTPATIENT_CLINIC_OR_DEPARTMENT_OTHER): Payer: Self-pay

## 2023-08-09 ENCOUNTER — Other Ambulatory Visit: Payer: Self-pay

## 2023-08-09 DIAGNOSIS — M5414 Radiculopathy, thoracic region: Secondary | ICD-10-CM | POA: Diagnosis not present

## 2023-08-09 DIAGNOSIS — M5416 Radiculopathy, lumbar region: Secondary | ICD-10-CM | POA: Diagnosis not present

## 2023-08-10 ENCOUNTER — Other Ambulatory Visit: Payer: Self-pay

## 2023-08-11 ENCOUNTER — Other Ambulatory Visit (HOSPITAL_BASED_OUTPATIENT_CLINIC_OR_DEPARTMENT_OTHER): Payer: Self-pay

## 2023-08-16 ENCOUNTER — Other Ambulatory Visit (HOSPITAL_BASED_OUTPATIENT_CLINIC_OR_DEPARTMENT_OTHER): Payer: Self-pay

## 2023-08-16 ENCOUNTER — Other Ambulatory Visit: Payer: Self-pay

## 2023-08-16 MED ORDER — AZITHROMYCIN 250 MG PO TABS
ORAL_TABLET | ORAL | 0 refills | Status: DC
  Filled 2023-08-16: qty 6, 5d supply, fill #0

## 2023-08-20 ENCOUNTER — Other Ambulatory Visit: Payer: Self-pay

## 2023-08-23 DIAGNOSIS — J3081 Allergic rhinitis due to animal (cat) (dog) hair and dander: Secondary | ICD-10-CM | POA: Diagnosis not present

## 2023-08-23 DIAGNOSIS — J301 Allergic rhinitis due to pollen: Secondary | ICD-10-CM | POA: Diagnosis not present

## 2023-08-23 DIAGNOSIS — J3089 Other allergic rhinitis: Secondary | ICD-10-CM | POA: Diagnosis not present

## 2023-08-25 ENCOUNTER — Other Ambulatory Visit (HOSPITAL_BASED_OUTPATIENT_CLINIC_OR_DEPARTMENT_OTHER): Payer: Self-pay

## 2023-08-25 DIAGNOSIS — M8580 Other specified disorders of bone density and structure, unspecified site: Secondary | ICD-10-CM | POA: Diagnosis not present

## 2023-08-25 DIAGNOSIS — I10 Essential (primary) hypertension: Secondary | ICD-10-CM | POA: Diagnosis not present

## 2023-08-25 DIAGNOSIS — M545 Low back pain, unspecified: Secondary | ICD-10-CM | POA: Diagnosis not present

## 2023-08-25 DIAGNOSIS — K219 Gastro-esophageal reflux disease without esophagitis: Secondary | ICD-10-CM | POA: Diagnosis not present

## 2023-08-25 DIAGNOSIS — J302 Other seasonal allergic rhinitis: Secondary | ICD-10-CM | POA: Diagnosis not present

## 2023-08-25 DIAGNOSIS — M1990 Unspecified osteoarthritis, unspecified site: Secondary | ICD-10-CM | POA: Diagnosis not present

## 2023-08-25 DIAGNOSIS — F419 Anxiety disorder, unspecified: Secondary | ICD-10-CM | POA: Diagnosis not present

## 2023-08-25 DIAGNOSIS — E785 Hyperlipidemia, unspecified: Secondary | ICD-10-CM | POA: Diagnosis not present

## 2023-08-25 DIAGNOSIS — E669 Obesity, unspecified: Secondary | ICD-10-CM | POA: Diagnosis not present

## 2023-08-27 ENCOUNTER — Other Ambulatory Visit (HOSPITAL_BASED_OUTPATIENT_CLINIC_OR_DEPARTMENT_OTHER): Payer: Self-pay

## 2023-08-27 DIAGNOSIS — N200 Calculus of kidney: Secondary | ICD-10-CM | POA: Diagnosis not present

## 2023-08-27 MED ORDER — LISINOPRIL 10 MG PO TABS
10.0000 mg | ORAL_TABLET | Freq: Every day | ORAL | 3 refills | Status: DC
  Filled 2023-08-27: qty 90, 90d supply, fill #0
  Filled 2023-09-17 – 2023-11-12 (×2): qty 90, 90d supply, fill #1
  Filled 2024-02-17: qty 90, 90d supply, fill #2
  Filled 2024-05-13: qty 90, 90d supply, fill #3

## 2023-08-28 DIAGNOSIS — J301 Allergic rhinitis due to pollen: Secondary | ICD-10-CM | POA: Diagnosis not present

## 2023-08-28 DIAGNOSIS — J3089 Other allergic rhinitis: Secondary | ICD-10-CM | POA: Diagnosis not present

## 2023-08-28 DIAGNOSIS — J3081 Allergic rhinitis due to animal (cat) (dog) hair and dander: Secondary | ICD-10-CM | POA: Diagnosis not present

## 2023-08-29 DIAGNOSIS — M533 Sacrococcygeal disorders, not elsewhere classified: Secondary | ICD-10-CM | POA: Diagnosis not present

## 2023-09-04 DIAGNOSIS — J3089 Other allergic rhinitis: Secondary | ICD-10-CM | POA: Diagnosis not present

## 2023-09-04 DIAGNOSIS — J301 Allergic rhinitis due to pollen: Secondary | ICD-10-CM | POA: Diagnosis not present

## 2023-09-04 DIAGNOSIS — J3081 Allergic rhinitis due to animal (cat) (dog) hair and dander: Secondary | ICD-10-CM | POA: Diagnosis not present

## 2023-09-17 ENCOUNTER — Other Ambulatory Visit: Payer: Self-pay

## 2023-09-17 ENCOUNTER — Other Ambulatory Visit (HOSPITAL_BASED_OUTPATIENT_CLINIC_OR_DEPARTMENT_OTHER): Payer: Self-pay

## 2023-09-17 MED ORDER — LEVOCETIRIZINE DIHYDROCHLORIDE 5 MG PO TABS
5.0000 mg | ORAL_TABLET | Freq: Every evening | ORAL | 0 refills | Status: DC
  Filled 2023-09-17: qty 90, 90d supply, fill #0

## 2023-09-17 MED ORDER — MONTELUKAST SODIUM 10 MG PO TABS
10.0000 mg | ORAL_TABLET | Freq: Every evening | ORAL | 0 refills | Status: DC
  Filled 2023-09-17: qty 90, 90d supply, fill #0

## 2023-09-18 ENCOUNTER — Other Ambulatory Visit (HOSPITAL_BASED_OUTPATIENT_CLINIC_OR_DEPARTMENT_OTHER): Payer: Self-pay

## 2023-09-18 ENCOUNTER — Other Ambulatory Visit: Payer: Self-pay

## 2023-09-18 DIAGNOSIS — J301 Allergic rhinitis due to pollen: Secondary | ICD-10-CM | POA: Diagnosis not present

## 2023-09-18 DIAGNOSIS — J3089 Other allergic rhinitis: Secondary | ICD-10-CM | POA: Diagnosis not present

## 2023-09-18 DIAGNOSIS — J3081 Allergic rhinitis due to animal (cat) (dog) hair and dander: Secondary | ICD-10-CM | POA: Diagnosis not present

## 2023-09-19 ENCOUNTER — Other Ambulatory Visit: Payer: Self-pay | Admitting: Neurology

## 2023-09-19 ENCOUNTER — Other Ambulatory Visit: Payer: Self-pay

## 2023-09-19 ENCOUNTER — Other Ambulatory Visit (HOSPITAL_COMMUNITY): Payer: Self-pay

## 2023-09-19 ENCOUNTER — Other Ambulatory Visit (HOSPITAL_BASED_OUTPATIENT_CLINIC_OR_DEPARTMENT_OTHER): Payer: Self-pay

## 2023-09-19 DIAGNOSIS — G245 Blepharospasm: Secondary | ICD-10-CM

## 2023-09-19 MED ORDER — TRAMADOL HCL 50 MG PO TABS
100.0000 mg | ORAL_TABLET | Freq: Three times a day (TID) | ORAL | 0 refills | Status: DC
  Filled 2023-09-19 (×3): qty 180, 30d supply, fill #0

## 2023-09-19 MED ORDER — LORAZEPAM 0.5 MG PO TABS
0.5000 mg | ORAL_TABLET | Freq: Two times a day (BID) | ORAL | 2 refills | Status: DC
  Filled 2023-09-19 (×3): qty 60, 30d supply, fill #0
  Filled 2023-10-18 – 2023-10-19 (×7): qty 60, 30d supply, fill #1
  Filled 2023-11-18: qty 60, 30d supply, fill #2

## 2023-09-20 ENCOUNTER — Other Ambulatory Visit: Payer: Self-pay

## 2023-09-27 DIAGNOSIS — J3081 Allergic rhinitis due to animal (cat) (dog) hair and dander: Secondary | ICD-10-CM | POA: Diagnosis not present

## 2023-09-27 DIAGNOSIS — J301 Allergic rhinitis due to pollen: Secondary | ICD-10-CM | POA: Diagnosis not present

## 2023-09-27 DIAGNOSIS — J3089 Other allergic rhinitis: Secondary | ICD-10-CM | POA: Diagnosis not present

## 2023-10-11 DIAGNOSIS — J301 Allergic rhinitis due to pollen: Secondary | ICD-10-CM | POA: Diagnosis not present

## 2023-10-11 DIAGNOSIS — J3081 Allergic rhinitis due to animal (cat) (dog) hair and dander: Secondary | ICD-10-CM | POA: Diagnosis not present

## 2023-10-11 DIAGNOSIS — J3089 Other allergic rhinitis: Secondary | ICD-10-CM | POA: Diagnosis not present

## 2023-10-15 ENCOUNTER — Other Ambulatory Visit: Payer: Self-pay | Admitting: Urology

## 2023-10-17 DIAGNOSIS — J3081 Allergic rhinitis due to animal (cat) (dog) hair and dander: Secondary | ICD-10-CM | POA: Diagnosis not present

## 2023-10-17 DIAGNOSIS — J301 Allergic rhinitis due to pollen: Secondary | ICD-10-CM | POA: Diagnosis not present

## 2023-10-17 DIAGNOSIS — J3089 Other allergic rhinitis: Secondary | ICD-10-CM | POA: Diagnosis not present

## 2023-10-18 ENCOUNTER — Other Ambulatory Visit (HOSPITAL_BASED_OUTPATIENT_CLINIC_OR_DEPARTMENT_OTHER): Payer: Self-pay

## 2023-10-18 ENCOUNTER — Other Ambulatory Visit: Payer: Self-pay

## 2023-10-19 ENCOUNTER — Ambulatory Visit (INDEPENDENT_AMBULATORY_CARE_PROVIDER_SITE_OTHER): Payer: Medicare PPO | Admitting: Neurology

## 2023-10-19 ENCOUNTER — Other Ambulatory Visit (HOSPITAL_BASED_OUTPATIENT_CLINIC_OR_DEPARTMENT_OTHER): Payer: Self-pay

## 2023-10-19 DIAGNOSIS — G245 Blepharospasm: Secondary | ICD-10-CM

## 2023-10-19 MED ORDER — TRAMADOL HCL 50 MG PO TABS
100.0000 mg | ORAL_TABLET | Freq: Three times a day (TID) | ORAL | 0 refills | Status: DC
  Filled 2023-10-19: qty 180, 30d supply, fill #0

## 2023-10-19 MED ORDER — ONABOTULINUMTOXINA 100 UNITS IJ SOLR
100.0000 [IU] | Freq: Once | INTRAMUSCULAR | Status: AC
  Administered 2023-10-19: 20 [IU] via INTRAMUSCULAR

## 2023-10-19 NOTE — Procedures (Signed)
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     Consent obtained from: The patient Benefits discussed included, but were not limited to decreased muscle tightness, increased joint range of motion, and decreased pain.  Risk discussed included, but were not limited pain and discomfort, bleeding, increased ptosis, increased facial asymmetry, bruising, excessive weakness, venous thrombosis, muscle atrophy and dysphagia.  A copy of the patient medication guide was given to the patient which explains the blackbox warning.  Patients identity and treatment sites confirmed Yes.  .  Details of Procedure: Skin was cleaned with alcohol.   Prior to injection, the needle plunger was aspirated to make sure the needle was not within a blood vessel.  There was no blood retrieved on aspiration.    Following is a summary of the muscles injected  And the amount of Botulinum toxin used:  Injections  Location Left  Right Units Number of sites        Corrugator      Frontalis      Lower Lid, Lateral 2.5 2.5 5.0 1 each side  Lower Lid Medial 2.5  2.5 1  Upper Lid, Lateral 2.5 2.5 5.0 1 each side  Upper Lid, Medial      Canthus 5.0 2.5 7.5 1 each side  Temporalis      Masseter      Procerus      Zygomaticus Major      TOTAL UNITS:   20    Agent: Botulinum Type A ( Onobotulinum Toxin type A ).  1 vials of Botox were used, each containing 50 units and freshly diluted with 2 mL of sterile, non-perserved saline   Total injected (Units): 20  Total wasted (Units): 80 Pt tolerated procedure well; no complications  Reinjection is anticipated in 3 months. 

## 2023-10-22 DIAGNOSIS — R8271 Bacteriuria: Secondary | ICD-10-CM | POA: Diagnosis not present

## 2023-10-22 DIAGNOSIS — N200 Calculus of kidney: Secondary | ICD-10-CM | POA: Diagnosis not present

## 2023-10-23 ENCOUNTER — Other Ambulatory Visit (HOSPITAL_BASED_OUTPATIENT_CLINIC_OR_DEPARTMENT_OTHER): Payer: Self-pay

## 2023-10-25 ENCOUNTER — Other Ambulatory Visit (HOSPITAL_BASED_OUTPATIENT_CLINIC_OR_DEPARTMENT_OTHER): Payer: Self-pay

## 2023-10-25 MED ORDER — AZELASTINE HCL 137 MCG/SPRAY NA SOLN
1.0000 | Freq: Two times a day (BID) | NASAL | 2 refills | Status: DC
  Filled 2023-10-25: qty 90, 90d supply, fill #0

## 2023-10-30 ENCOUNTER — Encounter (HOSPITAL_BASED_OUTPATIENT_CLINIC_OR_DEPARTMENT_OTHER): Payer: Self-pay | Admitting: Urology

## 2023-10-30 DIAGNOSIS — J3089 Other allergic rhinitis: Secondary | ICD-10-CM | POA: Diagnosis not present

## 2023-10-30 DIAGNOSIS — J3081 Allergic rhinitis due to animal (cat) (dog) hair and dander: Secondary | ICD-10-CM | POA: Diagnosis not present

## 2023-10-30 DIAGNOSIS — J301 Allergic rhinitis due to pollen: Secondary | ICD-10-CM | POA: Diagnosis not present

## 2023-10-30 NOTE — Progress Notes (Signed)
Spoke w/ via phone for pre-op interview--- pt Lab needs dos---- [employer], ekg        Lab results------ no COVID test -----patient states asymptomatic no test needed Arrive at -------  1015 on 10-31-2023 NPO after MN NO Solid Food.  Clear liquids from MN until--- 0915 Med rec completed Medications to take morning of surgery ----- gabapentin, xyzal, felodipine, prilosec, flonase spray, eye drop as usual Diabetic medication ----- Patient instructed no nail polish to be worn day of surgery Patient instructed to bring photo id and insurance card day of surgery Patient aware to have Driver (ride ) / caregiver    for 24 hours after surgery - sister, ann Patient Special Instructions ----- n/a Pre-Op special Instructions ----- n/a Patient verbalized understanding of instructions that were given at this phone interview. Patient denies chest pain, sob, fever, cough at the interview.

## 2023-10-31 ENCOUNTER — Ambulatory Visit (HOSPITAL_BASED_OUTPATIENT_CLINIC_OR_DEPARTMENT_OTHER)
Admission: RE | Admit: 2023-10-31 | Discharge: 2023-10-31 | Disposition: A | Discharge status: Home / Self Care | Payer: Medicare PPO | Attending: Urology | Admitting: Urology

## 2023-10-31 ENCOUNTER — Ambulatory Visit (HOSPITAL_BASED_OUTPATIENT_CLINIC_OR_DEPARTMENT_OTHER): Payer: Self-pay | Admitting: Anesthesiology

## 2023-10-31 ENCOUNTER — Encounter (HOSPITAL_BASED_OUTPATIENT_CLINIC_OR_DEPARTMENT_OTHER): Payer: Self-pay | Admitting: Urology

## 2023-10-31 ENCOUNTER — Other Ambulatory Visit: Payer: Self-pay

## 2023-10-31 ENCOUNTER — Encounter (HOSPITAL_BASED_OUTPATIENT_CLINIC_OR_DEPARTMENT_OTHER)
Admission: RE | Disposition: A | Discharge status: Home / Self Care | Payer: Self-pay | Source: Home / Self Care | Attending: Urology

## 2023-10-31 DIAGNOSIS — K219 Gastro-esophageal reflux disease without esophagitis: Secondary | ICD-10-CM | POA: Insufficient documentation

## 2023-10-31 DIAGNOSIS — N200 Calculus of kidney: Secondary | ICD-10-CM | POA: Insufficient documentation

## 2023-10-31 DIAGNOSIS — F419 Anxiety disorder, unspecified: Secondary | ICD-10-CM | POA: Insufficient documentation

## 2023-10-31 DIAGNOSIS — Z79899 Other long term (current) drug therapy: Secondary | ICD-10-CM | POA: Insufficient documentation

## 2023-10-31 DIAGNOSIS — I10 Essential (primary) hypertension: Secondary | ICD-10-CM

## 2023-10-31 DIAGNOSIS — M1990 Unspecified osteoarthritis, unspecified site: Secondary | ICD-10-CM | POA: Insufficient documentation

## 2023-10-31 DIAGNOSIS — G4733 Obstructive sleep apnea (adult) (pediatric): Secondary | ICD-10-CM | POA: Diagnosis not present

## 2023-10-31 DIAGNOSIS — J45909 Unspecified asthma, uncomplicated: Secondary | ICD-10-CM | POA: Diagnosis not present

## 2023-10-31 DIAGNOSIS — Z01818 Encounter for other preprocedural examination: Secondary | ICD-10-CM

## 2023-10-31 HISTORY — DX: Other chronic pain: G89.29

## 2023-10-31 HISTORY — DX: Personal history of urinary calculi: Z87.442

## 2023-10-31 HISTORY — DX: Presence of spectacles and contact lenses: Z97.3

## 2023-10-31 HISTORY — PX: CYSTOSCOPY/URETEROSCOPY/HOLMIUM LASER/STENT PLACEMENT: SHX6546

## 2023-10-31 HISTORY — DX: Iron deficiency: E61.1

## 2023-10-31 HISTORY — DX: Other intervertebral disc degeneration, thoracolumbar region: M51.35

## 2023-10-31 HISTORY — DX: Clonic hemifacial spasm, left: G51.32

## 2023-10-31 HISTORY — DX: Gastro-esophageal reflux disease without esophagitis: K21.9

## 2023-10-31 HISTORY — DX: Allergic rhinitis, unspecified: J30.9

## 2023-10-31 HISTORY — DX: Calculus of kidney: N20.0

## 2023-10-31 HISTORY — DX: Obstructive sleep apnea (adult) (pediatric): G47.33

## 2023-10-31 LAB — POCT I-STAT, CHEM 8
BUN: 24 mg/dL — ABNORMAL HIGH (ref 8–23)
Calcium, Ion: 1.14 mmol/L — ABNORMAL LOW (ref 1.15–1.40)
Chloride: 103 mmol/L (ref 98–111)
Creatinine, Ser: 0.9 mg/dL (ref 0.44–1.00)
Glucose, Bld: 93 mg/dL (ref 70–99)
HCT: 41 % (ref 36.0–46.0)
Hemoglobin: 13.9 g/dL (ref 12.0–15.0)
Potassium: 4.7 mmol/L (ref 3.5–5.1)
Sodium: 140 mmol/L (ref 135–145)
TCO2: 28 mmol/L (ref 22–32)

## 2023-10-31 SURGERY — CYSTOSCOPY/URETEROSCOPY/HOLMIUM LASER/STENT PLACEMENT
Anesthesia: General | Site: Ureter | Laterality: Left

## 2023-10-31 MED ORDER — ONDANSETRON HCL 4 MG/2ML IJ SOLN
INTRAMUSCULAR | Status: DC | PRN
  Administered 2023-10-31: 4 mg via INTRAVENOUS

## 2023-10-31 MED ORDER — PROPOFOL 10 MG/ML IV BOLUS
INTRAVENOUS | Status: DC | PRN
  Administered 2023-10-31: 20 mg via INTRAVENOUS
  Administered 2023-10-31: 150 mg via INTRAVENOUS

## 2023-10-31 MED ORDER — SUCCINYLCHOLINE CHLORIDE 200 MG/10ML IV SOSY
PREFILLED_SYRINGE | INTRAVENOUS | Status: DC | PRN
  Administered 2023-10-31: 140 mg via INTRAVENOUS

## 2023-10-31 MED ORDER — KETOROLAC TROMETHAMINE 30 MG/ML IJ SOLN
INTRAMUSCULAR | Status: AC
  Filled 2023-10-31: qty 1

## 2023-10-31 MED ORDER — FENTANYL CITRATE (PF) 100 MCG/2ML IJ SOLN
INTRAMUSCULAR | Status: DC | PRN
  Administered 2023-10-31: 50 ug via INTRAVENOUS
  Administered 2023-10-31 (×2): 25 ug via INTRAVENOUS

## 2023-10-31 MED ORDER — ACETAMINOPHEN 325 MG PO TABS: 325.0000 mg | ORAL_TABLET | ORAL | Status: DC | PRN

## 2023-10-31 MED ORDER — PHENAZOPYRIDINE HCL 200 MG PO TABS: 200.0000 mg | ORAL_TABLET | Freq: Three times a day (TID) | ORAL | 0 refills | Status: AC | PRN

## 2023-10-31 MED ORDER — LIDOCAINE 2% (20 MG/ML) 5 ML SYRINGE
INTRAMUSCULAR | Status: DC | PRN
  Administered 2023-10-31: 100 mg via INTRAVENOUS

## 2023-10-31 MED ORDER — TRAMADOL HCL 50 MG PO TABS: 50.0000 mg | ORAL_TABLET | Freq: Four times a day (QID) | ORAL | 0 refills | Status: AC | PRN

## 2023-10-31 MED ORDER — IOHEXOL 300 MG/ML  SOLN
INTRAMUSCULAR | Status: DC | PRN
  Administered 2023-10-31: 5 mL via URETHRAL

## 2023-10-31 MED ORDER — ONDANSETRON HCL 4 MG/2ML IJ SOLN
INTRAMUSCULAR | Status: AC
  Filled 2023-10-31: qty 2

## 2023-10-31 MED ORDER — SODIUM CHLORIDE 0.9 % IR SOLN
Status: DC | PRN
  Administered 2023-10-31: 3000 mL

## 2023-10-31 MED ORDER — FENTANYL CITRATE (PF) 100 MCG/2ML IJ SOLN
INTRAMUSCULAR | Status: AC
  Filled 2023-10-31: qty 2

## 2023-10-31 MED ORDER — CIPROFLOXACIN IN D5W 400 MG/200ML IV SOLN
INTRAVENOUS | Status: AC
  Filled 2023-10-31: qty 200

## 2023-10-31 MED ORDER — OXYBUTYNIN CHLORIDE 5 MG PO TABS: 5.0000 mg | ORAL_TABLET | Freq: Three times a day (TID) | ORAL | 1 refills | Status: AC | PRN

## 2023-10-31 MED ORDER — KETOROLAC TROMETHAMINE 30 MG/ML IJ SOLN
INTRAMUSCULAR | Status: DC | PRN
  Administered 2023-10-31: 30 mg via INTRAVENOUS

## 2023-10-31 MED ORDER — ACETAMINOPHEN 160 MG/5ML PO SOLN: 325.0000 mg | ORAL | Status: DC | PRN

## 2023-10-31 MED ORDER — STERILE WATER FOR IRRIGATION IR SOLN
Status: DC | PRN
  Administered 2023-10-31: 500 mL

## 2023-10-31 MED ORDER — CIPROFLOXACIN IN D5W 400 MG/200ML IV SOLN
400.0000 mg | INTRAVENOUS | Status: AC
  Administered 2023-10-31: 400 mg via INTRAVENOUS

## 2023-10-31 MED ORDER — OXYCODONE HCL 5 MG/5ML PO SOLN: 5.0000 mg | Freq: Once | ORAL | Status: DC | PRN

## 2023-10-31 MED ORDER — ROCURONIUM BROMIDE 100 MG/10ML IV SOLN
INTRAVENOUS | Status: DC | PRN
  Administered 2023-10-31: 10 mg via INTRAVENOUS

## 2023-10-31 MED ORDER — ONDANSETRON HCL 4 MG/2ML IJ SOLN: 4.0000 mg | Freq: Once | INTRAMUSCULAR | Status: DC | PRN

## 2023-10-31 MED ORDER — DEXAMETHASONE SODIUM PHOSPHATE 10 MG/ML IJ SOLN
INTRAMUSCULAR | Status: DC | PRN
  Administered 2023-10-31: 5 mg via INTRAVENOUS

## 2023-10-31 MED ORDER — EPHEDRINE 5 MG/ML INJ
INTRAVENOUS | Status: AC
  Filled 2023-10-31: qty 5

## 2023-10-31 MED ORDER — MEPERIDINE HCL 25 MG/ML IJ SOLN: 6.2500 mg | INTRAMUSCULAR | Status: DC | PRN

## 2023-10-31 MED ORDER — LACTATED RINGERS IV SOLN: INTRAVENOUS | Status: DC

## 2023-10-31 MED ORDER — EPHEDRINE SULFATE-NACL 50-0.9 MG/10ML-% IV SOSY
PREFILLED_SYRINGE | INTRAVENOUS | Status: DC | PRN
  Administered 2023-10-31 (×2): 10 mg via INTRAVENOUS

## 2023-10-31 MED ORDER — DEXAMETHASONE SODIUM PHOSPHATE 10 MG/ML IJ SOLN
INTRAMUSCULAR | Status: AC
  Filled 2023-10-31: qty 1

## 2023-10-31 MED ORDER — OXYCODONE HCL 5 MG PO TABS: 5.0000 mg | ORAL_TABLET | Freq: Once | ORAL | Status: DC | PRN

## 2023-10-31 MED ORDER — PROPOFOL 10 MG/ML IV BOLUS
INTRAVENOUS | Status: AC
  Filled 2023-10-31: qty 20

## 2023-10-31 MED ORDER — FENTANYL CITRATE (PF) 100 MCG/2ML IJ SOLN: 25.0000 ug | INTRAMUSCULAR | Status: DC | PRN

## 2023-10-31 MED ORDER — CIPROFLOXACIN HCL 500 MG PO TABS: 500.0000 mg | ORAL_TABLET | Freq: Two times a day (BID) | ORAL | 0 refills | Status: AC

## 2023-10-31 SURGICAL SUPPLY — 25 items
BAG DRAIN URO-CYSTO SKYTR STRL (DRAIN) ×2 IMPLANT
BASKET STONE 1.7 NGAGE (UROLOGICAL SUPPLIES) IMPLANT
BASKET ZERO TIP NITINOL 2.4FR (BASKET) ×2 IMPLANT
BENZOIN TINCTURE PRP APPL 2/3 (GAUZE/BANDAGES/DRESSINGS) IMPLANT
CATH URETL OPEN 5X70 (CATHETERS) IMPLANT
CLOTH BEACON ORANGE TIMEOUT ST (SAFETY) ×2 IMPLANT
GLOVE BIO SURGEON STRL SZ7.5 (GLOVE) ×2 IMPLANT
GOWN STRL REUS W/TWL XL LVL3 (GOWN DISPOSABLE) ×2 IMPLANT
GUIDEWIRE STR DUAL SENSOR (WIRE) IMPLANT
GUIDEWIRE ZIPWRE .038 STRAIGHT (WIRE) ×2 IMPLANT
IV NS IRRIG 3000ML ARTHROMATIC (IV SOLUTION) ×4 IMPLANT
KIT TURNOVER CYSTO (KITS) ×2 IMPLANT
LASER FIB FLEXIVA PULSE ID 365 (Laser) IMPLANT
MANIFOLD NEPTUNE II (INSTRUMENTS) ×2 IMPLANT
NS IRRIG 500ML POUR BTL (IV SOLUTION) ×2 IMPLANT
PACK CYSTO (CUSTOM PROCEDURE TRAY) ×2 IMPLANT
SHEATH NAVIGATOR HD 11/13X36 (SHEATH) IMPLANT
SLEEVE SCD COMPRESS KNEE MED (STOCKING) ×2 IMPLANT
STENT URET 6FRX24 CONTOUR (STENTS) IMPLANT
STRIP CLOSURE SKIN 1/2X4 (GAUZE/BANDAGES/DRESSINGS) IMPLANT
SYR 10ML LL (SYRINGE) ×2 IMPLANT
TRACTIP FLEXIVA PULS ID 200XHI (Laser) IMPLANT
TUBE CONNECTING 12X1/4 (SUCTIONS) IMPLANT
TUBING UROLOGY SET (TUBING) ×2 IMPLANT
WATER STERILE IRR 500ML POUR (IV SOLUTION) IMPLANT

## 2023-10-31 NOTE — Anesthesia Preprocedure Evaluation (Addendum)
Anesthesia Evaluation  Patient identified by MRN, date of birth, ID band Patient awake    Reviewed: Allergy & Precautions, NPO status , Patient's Chart, lab work & pertinent test results  Airway Mallampati: II  TM Distance: >3 FB Neck ROM: Full    Dental no notable dental hx. (+) Teeth Intact, Dental Advisory Given, Poor Dentition   Pulmonary asthma    Pulmonary exam normal breath sounds clear to auscultation       Cardiovascular hypertension, Pt. on medications Normal cardiovascular exam Rhythm:Regular Rate:Normal     Neuro/Psych  PSYCHIATRIC DISORDERS Anxiety     negative neurological ROS     GI/Hepatic Neg liver ROS,GERD  Medicated,,  Endo/Other  negative endocrine ROS    Renal/GU negative Renal ROS  negative genitourinary   Musculoskeletal  (+) Arthritis ,    Abdominal   Peds  Hematology negative hematology ROS (+)   Anesthesia Other Findings   Reproductive/Obstetrics                             Anesthesia Physical Anesthesia Plan  ASA: 3  Anesthesia Plan: General   Post-op Pain Management: Minimal or no pain anticipated, Tylenol PO (pre-op)* and Celebrex PO (pre-op)*   Induction: Intravenous  PONV Risk Score and Plan: 3 and Ondansetron, Dexamethasone and Midazolam  Airway Management Planned: LMA  Additional Equipment: None  Intra-op Plan:   Post-operative Plan: Extubation in OR  Informed Consent: I have reviewed the patients History and Physical, chart, labs and discussed the procedure including the risks, benefits and alternatives for the proposed anesthesia with the patient or authorized representative who has indicated his/her understanding and acceptance.     Dental advisory given  Plan Discussed with: CRNA and Anesthesiologist  Anesthesia Plan Comments: (  )        Anesthesia Quick Evaluation

## 2023-10-31 NOTE — Discharge Instr - Supplementary Instructions (Signed)
May take Ibuprofen,Advil Alleve etc. After 6:42pm  if needed for discomfort.

## 2023-10-31 NOTE — Op Note (Signed)
Operative Note  Preoperative diagnosis:  1.  1 cm left renal stone  Postoperative diagnosis: 1.  1 cm left renal stone  Procedure(s): 1.  Cystoscopy with left ureteroscopy, holmium laser lithotripsy and left J stent placement 2.  Left retrograde pyelogram with intraoperative interpretation fluoroscopic imaging  Surgeon: Rhoderick Moody, MD  Assistants:  None  Anesthesia:  General  Complications:  None  EBL: Less than 5 mL  Specimens: 1.  None  Drains/Catheters: 1.  Left 6 French, 24 cm JJ stent without tether  Intraoperative findings:   Solitary left collecting system with no filling defects or dilation involving the left ureter.  There was a filling defect within the left renal pelvis, consistent with the nonobstructing stone seen on recent cross-sectional imaging.  No other filling defects were seen within the left renal pelvis or its associated calyces.   No intravesical or urethral abnormalities were seen  Indication:  [name] is a 73 y.o. female with a 1 cm nonobstructing left renal stone.  She has been consented for the above procedures, voices understanding and wishes to proceed.  Description of procedure:  After informed consent was obtained, the patient was brought to the operating room and general LMA anesthesia was administered. The patient was then placed in the dorsolithotomy position and prepped and draped in the usual sterile fashion. A timeout was performed. A 23 French rigid cystoscope was then inserted into the urethral meatus and advanced into the bladder under direct vision. A complete bladder survey revealed no intravesical pathology.  A 5 French ureteral catheter was then inserted into the left ureteral orifice and a retrograde pyelogram was obtained, with the findings listed above.  A Glidewire was then used to intubate the lumen of the ureteral catheter and was advanced up to the left renal pelvis, under fluoroscopic guidance.  The catheter was  then removed, leaving the wire in place.  An additional sensor wire was then advanced up the left ureter to the left renal pelvis, under fluoroscopic guidance.  A 12/14 French 35 cm ureteral access sheath was then advanced over the sensor wire and into good position within the proximal aspect of the left ureter, confirming placement via fluoroscopy.  A flexible ureteroscope was then advanced through the lumen of the access sheath and into the left renal pelvis where her stone was identified.  A 200 m holmium laser was then used to fracture the stone into 1 mm or less fragments.  The flexible ureteroscope and ureteral access sheath were then removed under direct vision, identifying no evidence of ureteral trauma or luminal stone burden.  A 6 French, 24 cm JJ stent was then advanced over the Glidewire and into good position within the left collecting system, confirming placement via fluoroscopy.  The patient's bladder was drained.  She tolerated the procedure well and was transferred to the postanesthesia in stable condition.  Plan: Follow-up in 1 week for office cystoscopy and stent removal

## 2023-10-31 NOTE — Discharge Instructions (Signed)

## 2023-10-31 NOTE — Transfer of Care (Signed)
Immediate Anesthesia Transfer of Care Note  Patient: [name]  Procedure(s) Performed: CYSTOSCOPY/LEFT URETEROSCOPY/RETROGRADE PYELOGRAM/HOLMIUM LASER/STENT PLACEMENT (Left: Ureter)  Patient Location: PACU  Anesthesia Type:General  Level of Consciousness: awake, alert , oriented, and patient cooperative  Airway & Oxygen Therapy: Patient Spontanous Breathing  Post-op Assessment: Report given to RN and Post -op Vital signs reviewed and stable  Post vital signs: Reviewed and stable  Last Vitals:  Vitals Value Taken Time  BP 117/76 10/31/23 1252  Temp 36.4 C 10/31/23 1255  Pulse 83 10/31/23 1255  Resp 11 10/31/23 1255  SpO2 95 % 10/31/23 1255  Vitals shown include unfiled device data.  Last Pain:  Vitals:   10/31/23 1051  TempSrc: Oral  PainSc: 0-No pain         Complications: No notable events documented.

## 2023-10-31 NOTE — Anesthesia Postprocedure Evaluation (Signed)
Anesthesia Post Note  Patient: [name]  Procedure(s) Performed: CYSTOSCOPY/LEFT URETEROSCOPY/RETROGRADE PYELOGRAM/HOLMIUM LASER/STENT PLACEMENT (Left: Ureter)     Patient location during evaluation: PACU Anesthesia Type: General Level of consciousness: awake and alert Pain management: pain level controlled Vital Signs Assessment: post-procedure vital signs reviewed and stable Respiratory status: spontaneous breathing, nonlabored ventilation, respiratory function stable and patient connected to nasal cannula oxygen Cardiovascular status: blood pressure returned to baseline and stable Postop Assessment: no apparent nausea or vomiting Anesthetic complications: no   No notable events documented.  Last Vitals:  Vitals:   10/31/23 1331 10/31/23 1344  BP:  (!) 142/79  Pulse: 69 93  Resp: 13 18  Temp:  (!) 36.3 C  SpO2: 100% 98%    Last Pain:  Vitals:   10/31/23 1400  TempSrc:   PainSc: 0-No pain                 Heavyn Yearsley

## 2023-10-31 NOTE — H&P (Signed)
Office Visit Report     10/22/2023   --------------------------------------------------------------------------------   [name]. [name]  MRN: [id number]  DOB: [birth year], 73 year old Female  PRIMARY CARE:  Georga Kaufmann, MD  PRIMARY CARE FAX:  [phone]  REFERRING:  Cristal Deer A. Liliane Shi, MD  PROVIDER:  Rhoderick Moody, M.D.  TREATING:  Anastasia Fiedler, P.A.  LOCATION:  [facility] [phone]     --------------------------------------------------------------------------------   CC/HPI: Left renal stone   Ms. [name] is a 73 year old female with a history of kidney stones.   08/27/2023: The patient was found to have a 1 cm left renal stone with no obvious signs of hydronephrosis on CT from 07/23/2023. She notes dull, intermittent left upper quadrant pain for the past several months, but specifically denies any flank pain, nausea/vomiting, fever/chills, dysuria or hematuria. She has a long history of kidney stones and has required both ureteroscopy and ESWL in the past. She is planning on going to [place] in early December and would like to defer any definitive stone management till after her trip.   10/22/2023: With history of left renal calculus presents today for preoperative history and physical exam in anticipation of undergoing ureteroscopy on 10/31/2023 with Dr. Liliane Shi. Today she is doing well and without complaints. She denies gross hematuria, dysuria, fever/chills, flank pain, nausea/vomiting, chest pain, shortness of breath, headaches.     ALLERGIES: Lorcet-HD CAPS Penicillins Septra TABS    MEDICATIONS: Percocet 5 mg-325 mg tablet 1 tablet PO Q 4 H PRN  Calcium + D TABS Oral  Clotrimazole-Betamethasone 1-0.05 % External Cream External  Erythromycin 2 % External Solution External  Felodipine ER 5 MG Oral Tablet Extended Release 24 Hour Oral  Lisinopril 10 MG Oral Tablet Oral  LORazepam 0.5 MG Oral Tablet Oral  Mobic 7.5 MG Oral Tablet Oral  Multiple Vitamin  TABS Oral  Neurontin 600 MG Oral Tablet Oral  Ondansetron Odt 4 mg tablet,disintegrating 1 tablet PO Q 4 H PRN Take as needed for nausea  Oxycodone-Acetaminophen 5-325 MG Oral Tablet 4 Oral  TraMADol HCl - 50 MG Oral Tablet Oral  Vitamin B-12 1000 MCG/ML SOLN Injection  Weekly Allergy Shots  Zolpidem Tartrate 5 MG Oral Tablet Oral     GU PSH: Hysterectomy Unilat SO - 2013       PSH Notes: Hysterectomy, Knee Surgery, Gastric Surgery For Morbid Obesity Gastric Bypass, Knee Surgery, Ankle Surgery, wrist surgery   NON-GU PSH: No Non-GU PSH    GU PMH: History of urolithiasis - 08/27/2023 Renal calculus - 08/27/2023 Dysuria, Dysuria - 2014 Oth GU systems Signs/Symptoms, Bladder pain - 2014 Ureteral calculus, Calculus of distal right ureter - 2014 Urinary Tract Inf, Unspec site, Urinary tract infection - 2014, Pyuria, - 2014      PMH Notes:  1898-10-02 00:00:00 - Note: Normal Routine History And Physical Adult  2011-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 14:07: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  2011-12-28 14:07:49 - Note: Tremor  2011-12-28 14:12:42 - Note: Arthritis   NON-GU PMH: Personal history of other diseases of the circulatory system, History of hypertension - 2014, History of cardiac disorder, - 2014 Personal history of other diseases of the nervous system and sense organs, History of sleep apnea - 2014    FAMILY HISTORY: Arthritis - Mother, Father Family Health Status - Mother's Age - Runs In Family Family Health Status Number - Runs In Family Father Deceased At Age86 ___ - Runs In Family Heart Disease - Father, Mother nephrolithiasis - Runs In Family Transient Ischemic Attack -  Father   SOCIAL HISTORY: Marital Status: Widowed Preferred Language: English; Ethnicity: Not Hispanic Or Latino; Race: White Current Smoking Status: Patient has never smoked.   Tobacco Use Assessment Completed: Used Tobacco in last 30 days? Has never drank.  Does not drink caffeine.     Notes: Occupation:, Caffeine Use, Never  A Smoker, Alcohol Use, Marital History - Widowed   REVIEW OF SYSTEMS:    GU Review Female:   Patient denies frequent urination, hard to postpone urination, burning /pain with urination, get up at night to urinate, leakage of urine, stream starts and stops, trouble starting your stream, have to strain to urinate, and being pregnant.  Gastrointestinal (Upper):   Patient denies nausea, vomiting, and indigestion/ heartburn.  Gastrointestinal (Lower):   Patient denies diarrhea and constipation.  Constitutional:   Patient denies fever, night sweats, weight loss, and fatigue.  Skin:   Patient denies skin rash/ lesion and itching.  Eyes:   Patient denies blurred vision and double vision.  Ears/ Nose/ Throat:   Patient denies sore throat and sinus problems.  Hematologic/Lymphatic:   Patient denies swollen glands and easy bruising.  Cardiovascular:   Patient denies leg swelling and chest pains.  Respiratory:   Patient denies cough and shortness of breath.  Endocrine:   Patient denies excessive thirst.  Musculoskeletal:   Patient denies back pain and joint pain.  Neurological:   Patient denies headaches and dizziness.  Psychologic:   Patient denies anxiety and depression.   VITAL SIGNS:      10/22/2023 12:44 PM  BP 138/76 mmHg  Pulse 79 /min  Temperature 97.7 F / 36.5 C   MULTI-SYSTEM PHYSICAL EXAMINATION:    Constitutional: Well-nourished. No physical deformities. Normally developed. Good grooming.  Neck: Neck symmetrical, not swollen. Normal tracheal position.  Respiratory: No labored breathing, no use of accessory muscles.   Cardiovascular: Normal temperature, normal extremity pulses, no swelling, no varicosities.  Lymphatic: No enlargement of neck, axillae, groin.  Skin: No paleness, no jaundice, no cyanosis. No lesion, no ulcer, no rash.  Neurologic / Psychiatric: Oriented to time, oriented to place, oriented to person. No depression, no anxiety, no agitation.  Gastrointestinal: No mass, no  tenderness, no rigidity, non obese abdomen.  Eyes: Normal conjunctivae. Normal eyelids.  Ears, Nose, Mouth, and Throat: Left ear no scars, no lesions, no masses. Right ear no scars, no lesions, no masses. Nose no scars, no lesions, no masses. Normal hearing. Normal lips.  Musculoskeletal: Normal gait and station of head and neck.     Complexity of Data:  Source Of History:  Patient, Medical Record Summary  Records Review:   Previous Doctor Records, Previous Patient Records  Urine Test Review:   Urinalysis, Urine Culture   10/22/23  Urinalysis  Urine Appearance Cloudy   Urine Color Yellow   Urine Glucose Neg mg/dL  Urine Bilirubin Neg mg/dL  Urine Ketones Trace mg/dL  Urine Specific Gravity 1.025   Urine Blood Neg ery/uL  Urine pH <=5.0   Urine Protein Trace mg/dL  Urine Urobilinogen 1.0 mg/dL  Urine Nitrites Neg   Urine Leukocyte Esterase 2+ leu/uL  Urine WBC/hpf 10 - 20/hpf   Urine RBC/hpf 0 - 2/hpf   Urine Epithelial Cells 6 - 10/hpf   Urine Bacteria Few (10-25/hpf)   Urine Mucous Present   Urine Yeast NS (Not Seen)   Urine Trichomonas Not Present   Urine Cystals Amorph Urates   Urine Casts Hyaline   Urine Sperm Not Present    PROCEDURES:  Visit Complexity - G2211          Urinalysis w/Scope Dipstick Dipstick Cont'd Micro  Color: Yellow Bilirubin: Neg mg/dL WBC/hpf: 10 - 82/NFA  Appearance: Cloudy Ketones: Trace mg/dL RBC/hpf: 0 - 2/hpf  Specific Gravity: 1.025 Blood: Neg ery/uL Bacteria: Few (10-25/hpf)  pH: <=5.0 Protein: Trace mg/dL Cystals: Amorph Urates  Glucose: Neg mg/dL Urobilinogen: 1.0 mg/dL Casts: Hyaline    Nitrites: Neg Trichomonas: Not Present    Leukocyte Esterase: 2+ leu/uL Mucous: Present      Epithelial Cells: 6 - 10/hpf      Yeast: NS (Not Seen)      Sperm: Not Present    Notes: microscopic exam completed on unspun sample due to QNS    ASSESSMENT:      ICD-10 Details  1 GU:   Renal calculus - N20.0 Chronic, Stable   PLAN:            Orders Labs Urine Culture          Schedule Return Visit/Planned Activity: Keep Scheduled Appointment          Document Letter(s):  Created for Patient: Clinical Summary         Notes:   Culture sent. There are no changes in the patients history or physical exam since last evaluation. Pt is scheduled to undergo ureteroscopy on 10/31/2023.   All questions were answered to the best of my ability.    The risks, benefits and alternatives of cystoscopy with LEFT ureteroscopy, laser lithotripsy and ureteral stent placement was discussed the patient. Risks included, but are not limited to: bleeding, urinary tract infection, ureteral injury/avulsion, ureteral stricture formation, retained stone fragments, the possibility that multiple surgeries may be required to treat the stone(s), MI, stroke, PE and the inherent risks of general anesthesia. The patient voices understanding and wishes to proceed.

## 2023-10-31 NOTE — Anesthesia Procedure Notes (Signed)
Procedure Name: Intubation Date/Time: 10/31/2023 12:03 PM  Performed by: Bishop Limbo, CRNAPre-anesthesia Checklist: Patient identified, Emergency Drugs available, Suction available and Patient being monitored Patient Re-evaluated:Patient Re-evaluated prior to induction Oxygen Delivery Method: Circle System Utilized Preoxygenation: Pre-oxygenation with 100% oxygen Induction Type: IV induction Ventilation: Mask ventilation without difficulty Laryngoscope Size: Mac and 3 Grade View: Grade I Tube type: Oral Tube size: 7.0 mm Number of attempts: 1 Airway Equipment and Method: Stylet Placement Confirmation: ETT inserted through vocal cords under direct vision, positive ETCO2 and breath sounds checked- equal and bilateral Secured at: 22 cm Tube secured with: Tape Dental Injury: Teeth and Oropharynx as per pre-operative assessment

## 2023-11-01 ENCOUNTER — Encounter (HOSPITAL_BASED_OUTPATIENT_CLINIC_OR_DEPARTMENT_OTHER): Payer: Self-pay | Admitting: Urology

## 2023-11-02 ENCOUNTER — Telehealth: Payer: Self-pay | Admitting: Neurology

## 2023-11-02 NOTE — Telephone Encounter (Signed)
Patient called to let Dr.Tat know people have been saying her mouth on the left side seems to be drooping. She wants to know if this is a side effect from the botox? Its is on her left side where she gets the botox injection done. She said this wasn't urgent but she is curious

## 2023-11-05 DIAGNOSIS — M533 Sacrococcygeal disorders, not elsewhere classified: Secondary | ICD-10-CM | POA: Diagnosis not present

## 2023-11-05 NOTE — Telephone Encounter (Signed)
Called patient and she let me know this has been going on for some time and she kept forgetting to tell Dr. Arbutus Leas until her sister noticed it. Patient said she isn't concerned and it isn't painful but she did want to make note of it.  Patients lips on her left side of her face droops down significantly. Right side much higher. She indicated that when she puts on her lipstick she has to go down on the left side

## 2023-11-06 NOTE — Telephone Encounter (Signed)
Called pateint and I let her know Dr. Iona Beard recommendation via voicemail and asked her to call back and let me know if she would like to stop injections to see if her lip will correct or if she wanted to continue

## 2023-11-09 DIAGNOSIS — N200 Calculus of kidney: Secondary | ICD-10-CM | POA: Diagnosis not present

## 2023-11-09 DIAGNOSIS — R8271 Bacteriuria: Secondary | ICD-10-CM | POA: Diagnosis not present

## 2023-11-14 DIAGNOSIS — L570 Actinic keratosis: Secondary | ICD-10-CM | POA: Diagnosis not present

## 2023-11-14 DIAGNOSIS — D1801 Hemangioma of skin and subcutaneous tissue: Secondary | ICD-10-CM | POA: Diagnosis not present

## 2023-11-14 DIAGNOSIS — L578 Other skin changes due to chronic exposure to nonionizing radiation: Secondary | ICD-10-CM | POA: Diagnosis not present

## 2023-11-14 DIAGNOSIS — L814 Other melanin hyperpigmentation: Secondary | ICD-10-CM | POA: Diagnosis not present

## 2023-11-14 DIAGNOSIS — L821 Other seborrheic keratosis: Secondary | ICD-10-CM | POA: Diagnosis not present

## 2023-11-16 NOTE — Telephone Encounter (Signed)
Pt called in requesting to speak with [facility] about this issue. She is not wanting to skip the next Botox

## 2023-11-16 NOTE — Telephone Encounter (Signed)
Patient called and requested a call back. She wants to continue Botox and doesn't feel that the Botox is causing this. She saw some friends the other day and they said they noticed it and looked like she had a stroke. It is the whole side of her face that feels heavy and numb especially her lip. Patient wants to know what she should do. She felt reaching out to Neurology first was the best thing to do

## 2023-11-18 ENCOUNTER — Other Ambulatory Visit (HOSPITAL_BASED_OUTPATIENT_CLINIC_OR_DEPARTMENT_OTHER): Payer: Self-pay

## 2023-11-19 ENCOUNTER — Other Ambulatory Visit: Payer: Self-pay

## 2023-11-19 DIAGNOSIS — J3089 Other allergic rhinitis: Secondary | ICD-10-CM | POA: Diagnosis not present

## 2023-11-19 DIAGNOSIS — J3081 Allergic rhinitis due to animal (cat) (dog) hair and dander: Secondary | ICD-10-CM | POA: Diagnosis not present

## 2023-11-19 DIAGNOSIS — R300 Dysuria: Secondary | ICD-10-CM | POA: Diagnosis not present

## 2023-11-19 DIAGNOSIS — R8271 Bacteriuria: Secondary | ICD-10-CM | POA: Diagnosis not present

## 2023-11-19 DIAGNOSIS — J301 Allergic rhinitis due to pollen: Secondary | ICD-10-CM | POA: Diagnosis not present

## 2023-11-20 ENCOUNTER — Other Ambulatory Visit (HOSPITAL_BASED_OUTPATIENT_CLINIC_OR_DEPARTMENT_OTHER): Payer: Self-pay

## 2023-11-20 MED ORDER — OSELTAMIVIR PHOSPHATE 75 MG PO CAPS
75.0000 mg | ORAL_CAPSULE | Freq: Two times a day (BID) | ORAL | 0 refills | Status: AC
  Filled 2023-11-20: qty 10, 5d supply, fill #0

## 2023-11-20 MED ORDER — TRAMADOL HCL 50 MG PO TABS
100.0000 mg | ORAL_TABLET | Freq: Three times a day (TID) | ORAL | 0 refills | Status: DC
  Filled 2023-11-20: qty 180, 30d supply, fill #0

## 2023-11-20 NOTE — Progress Notes (Unsigned)
 Virtual Visit Via Video    Consent was obtained for video visit:  {yes no:[id number]} Answered questions that patient had about telehealth interaction:  {yes no:[id number]} I discussed the limitations, risks, security and privacy concerns of performing an evaluation and management service by telemedicine. I also discussed with the patient that there may be a patient responsible charge related to this service. The patient expressed understanding and agreed to proceed.  Pt location: Home Physician Location: office Name of referring provider:  Thana Ates, MD I connected with [name] at patients initiation/request on 11/22/2023 at  3:00 PM EST by video enabled telemedicine application and verified that I am speaking with the correct person using two identifiers. Pt MRN:  [id number] Pt DOB:  Nov 24, 1950 Video Participants:  [name];  ***  Assessment/Plan:   ***Facial asymmetry, left  -According to my notes, patient has had some degree of left facial asymmetry for a decade, even prior to starting Botox.  At that point in time, patient had declined MRI, according to records.  It is well-known that hemifacial spasm can cause some degree of facial asymmetry, although I noted at that point in time that it was minor.  It would be a little bit unusual for her Botox to cause lower facial asymmetry, as we do not inject anything except orbicularis oculi.  We do not inject her nasalis or zygomaticus.  Nonetheless, she is now stating that the left side also feels somewhat numb.  She reports it is somewhat chronic, however, and therefore does not want to seek emergent treatment.  We will go ahead and do an MRI brain.  Subjective   ***Patient is following up today.  She states that her friends have noticed left facial droop and she wondered if it was from Botox.  I went back in old notes that were from a decade ago.  In September, 2015, which was prior to [place] even starting Botox I wrote in my note "there  is decreased nasolabial fold on the left but is just minor.  Minor left ptosis that fluctuates but does not change with ice test."  At that point in time I also noted that patient had declined MRI brain.  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  She reports today that her left side of her face felt heavy and numb, especially the lip.  She reports that it is chronic, however.  No other lateralizing weakness or paresthesias.  No speech trouble.  Her only Botox is right around the orbicularis oculi.  Current movement d/o meds:  ***   Current Outpatient Medications on File Prior to Visit  Medication Sig Dispense Refill   Azelastine HCl 137 MCG/SPRAY SOLN Place 1-2 sprays into both nostrils 2 (two) times daily. (Patient taking differently: Place 1-2 sprays into both nostrils at bedtime.) 90 mL 2   Calcium Carbonate-Vitamin D (CALCIUM + D PO) Take 1 tablet by mouth every evening.      clobetasol (OLUX) 0.05 % topical foam Apply 1 small application to the affected area on the scalp twice daily. (Patient taking differently: Apply topically 2 (two) times daily as needed. Apply 1 small application to the affected area on the scalp twice daily.) 100 g 2   cycloSPORINE (RESTASIS) 0.05 % ophthalmic emulsion Place 1 drop into both eyes 2 (two) times daily. 60 each 11   felodipine (PLENDIL)  5 MG 24 hr tablet Take 1 tablet (5 mg total) by mouth daily. (Patient taking differently: Take 5 mg by mouth daily.) 90 tablet 3   Ferrous Sulfate (IRON) 28 MG TABS Take 1 tablet by mouth daily after breakfast.     fluticasone (FLONASE) 50 MCG/ACT nasal spray Place 1-2 sprays into both nostrils daily. (Patient taking differently: Place 1-2 sprays into both nostrils daily.) 48 g 6   gabapentin (NEURONTIN) 600 MG tablet 1 tablet Orally Three times a day 90 days (Patient taking differently: Take 600 mg by mouth 3 (three)  times daily.) 270 tablet 3   levocetirizine (XYZAL) 5 MG tablet Take 1 tablet (5 mg total) by mouth every evening. 90 tablet 0   lidocaine (LIDODERM) 5 % Place 1 patch onto the skin every 12 (twelve) hours as directed *12 hours on, 12 hours off* 30 patch 5   lisinopril (ZESTRIL) 10 MG tablet Take 1 tablet (10 mg total) by mouth daily. (Patient taking differently: Take 10 mg by mouth daily.) 90 tablet 3   LORazepam (ATIVAN) 0.5 MG tablet Take 1 tablet by mouth twice daily as needed 60 tablet 2   meloxicam (MOBIC) 15 MG tablet Take 1 tablet (15 mg total) by mouth 2 (two) times daily. 180 tablet 3   montelukast (SINGULAIR) 10 MG tablet Take 1 tablet (10 mg total) by mouth every evening. 90 tablet 0   Multiple Vitamin (MULTIVITAMIN) tablet Take 1 tablet by mouth daily.     omeprazole (PRILOSEC) 40 MG capsule Take 1 capsule (40 mg total) by mouth in the morning before breakfast, may take additional capsule at bedtime if needed. (Patient taking differently: Take 40 mg by mouth in the morning.) 180 capsule 1   oxybutynin (DITROPAN) 5 MG tablet Take 1 tablet (5 mg total) by mouth every 8 (eight) hours as needed for bladder spasms. 30 tablet 1   phenazopyridine (PYRIDIUM) 200 MG tablet Take 1 tablet (200 mg total) by mouth 3 (three) times daily as needed (for pain with urination). 30 tablet 0   traMADol (ULTRAM) 50 MG tablet Take 2 tablets (100 mg total) by mouth 3 (three) times daily. 180 tablet 0   zolpidem (AMBIEN) 10 MG tablet Take 1 tablet (10 mg total) by mouth at bedtime as needed. (Patient taking differently: Take 10 mg by mouth at bedtime as needed for sleep.) 30 tablet 0   No current facility-administered medications on file prior to visit.     Objective   There were no vitals filed for this visit. GEN:  The patient appears stated age and is in NAD.  Neurological examination:  Orientation: The patient is alert and oriented x3. Cranial nerves: There is good facial symmetry. There is  ***facial hypomimia.  The speech is fluent and clear. Soft palate rises symmetrically and there is no tongue deviation. Hearing is intact to conversational tone. Motor: Strength is at least antigravity x 4.   Shoulder shrug is equal and symmetric.  There is no pronator drift.  Movement examination: Tone: unable Abnormal movements: *** Coordination:  There is *** decremation with RAM's, *** Gait and Station: The patient has *** difficulty arising out of a deep-seated chair without the use of the hands. The patient's stride length is ***.      Follow up Instructions      -I discussed the assessment and treatment plan with the patient. The patient was provided an opportunity to ask questions and all were answered. The patient agreed with the plan  and demonstrated an understanding of the instructions.   The patient was advised to call back or seek an in-person evaluation if the symptoms worsen or if the condition fails to improve as anticipated.    Total time spent on today's visit was ***minutes, including both face-to-face time and nonface-to-face time.  Time included that spent on review of records (prior notes available to me/labs/imaging if pertinent), discussing treatment and goals, answering patient's questions and coordinating care.   Kerin Salen, DO

## 2023-11-22 ENCOUNTER — Other Ambulatory Visit: Payer: Self-pay

## 2023-11-22 ENCOUNTER — Telehealth (INDEPENDENT_AMBULATORY_CARE_PROVIDER_SITE_OTHER): Payer: Medicare PPO | Admitting: Neurology

## 2023-11-22 DIAGNOSIS — G5132 Clonic hemifacial spasm, left: Secondary | ICD-10-CM

## 2023-11-22 DIAGNOSIS — R29810 Facial weakness: Secondary | ICD-10-CM | POA: Diagnosis not present

## 2023-11-22 DIAGNOSIS — G5131 Clonic hemifacial spasm, right: Secondary | ICD-10-CM

## 2023-11-23 DIAGNOSIS — F411 Generalized anxiety disorder: Secondary | ICD-10-CM | POA: Diagnosis not present

## 2023-11-28 DIAGNOSIS — J3089 Other allergic rhinitis: Secondary | ICD-10-CM | POA: Diagnosis not present

## 2023-11-28 DIAGNOSIS — J301 Allergic rhinitis due to pollen: Secondary | ICD-10-CM | POA: Diagnosis not present

## 2023-11-28 DIAGNOSIS — J3081 Allergic rhinitis due to animal (cat) (dog) hair and dander: Secondary | ICD-10-CM | POA: Diagnosis not present

## 2023-11-28 DIAGNOSIS — H1045 Other chronic allergic conjunctivitis: Secondary | ICD-10-CM | POA: Diagnosis not present

## 2023-12-11 ENCOUNTER — Other Ambulatory Visit (HOSPITAL_BASED_OUTPATIENT_CLINIC_OR_DEPARTMENT_OTHER): Payer: Self-pay

## 2023-12-11 ENCOUNTER — Other Ambulatory Visit: Payer: Self-pay

## 2023-12-11 DIAGNOSIS — J3081 Allergic rhinitis due to animal (cat) (dog) hair and dander: Secondary | ICD-10-CM | POA: Diagnosis not present

## 2023-12-11 DIAGNOSIS — J3089 Other allergic rhinitis: Secondary | ICD-10-CM | POA: Diagnosis not present

## 2023-12-11 DIAGNOSIS — J301 Allergic rhinitis due to pollen: Secondary | ICD-10-CM | POA: Diagnosis not present

## 2023-12-11 MED ORDER — LORAZEPAM 0.5 MG PO TABS
0.5000 mg | ORAL_TABLET | Freq: Two times a day (BID) | ORAL | 2 refills | Status: DC | PRN
  Filled 2023-12-11: qty 60, 30d supply, fill #0
  Filled 2024-01-20: qty 60, 30d supply, fill #1
  Filled 2024-02-17: qty 60, 30d supply, fill #2

## 2023-12-11 MED ORDER — TRAMADOL HCL 50 MG PO TABS
100.0000 mg | ORAL_TABLET | Freq: Three times a day (TID) | ORAL | 0 refills | Status: DC
  Filled 2023-12-18: qty 180, 30d supply, fill #0

## 2023-12-11 MED ORDER — MONTELUKAST SODIUM 10 MG PO TABS
10.0000 mg | ORAL_TABLET | Freq: Every evening | ORAL | 3 refills | Status: AC
  Filled 2023-12-11: qty 90, 90d supply, fill #0
  Filled 2024-01-20 – 2024-06-10 (×2): qty 90, 90d supply, fill #1
  Filled 2024-09-09: qty 90, 90d supply, fill #2

## 2023-12-12 ENCOUNTER — Other Ambulatory Visit: Payer: Self-pay

## 2023-12-13 ENCOUNTER — Other Ambulatory Visit (HOSPITAL_BASED_OUTPATIENT_CLINIC_OR_DEPARTMENT_OTHER): Payer: Self-pay

## 2023-12-15 DIAGNOSIS — M533 Sacrococcygeal disorders, not elsewhere classified: Secondary | ICD-10-CM | POA: Diagnosis not present

## 2023-12-16 DIAGNOSIS — M5414 Radiculopathy, thoracic region: Secondary | ICD-10-CM | POA: Diagnosis not present

## 2023-12-17 DIAGNOSIS — H16223 Keratoconjunctivitis sicca, not specified as Sjogren's, bilateral: Secondary | ICD-10-CM | POA: Diagnosis not present

## 2023-12-17 DIAGNOSIS — H43813 Vitreous degeneration, bilateral: Secondary | ICD-10-CM | POA: Diagnosis not present

## 2023-12-17 DIAGNOSIS — H2513 Age-related nuclear cataract, bilateral: Secondary | ICD-10-CM | POA: Diagnosis not present

## 2023-12-18 ENCOUNTER — Other Ambulatory Visit (HOSPITAL_BASED_OUTPATIENT_CLINIC_OR_DEPARTMENT_OTHER): Payer: Self-pay

## 2023-12-18 DIAGNOSIS — J3089 Other allergic rhinitis: Secondary | ICD-10-CM | POA: Diagnosis not present

## 2023-12-18 DIAGNOSIS — J3081 Allergic rhinitis due to animal (cat) (dog) hair and dander: Secondary | ICD-10-CM | POA: Diagnosis not present

## 2023-12-18 DIAGNOSIS — J301 Allergic rhinitis due to pollen: Secondary | ICD-10-CM | POA: Diagnosis not present

## 2023-12-19 ENCOUNTER — Other Ambulatory Visit (HOSPITAL_BASED_OUTPATIENT_CLINIC_OR_DEPARTMENT_OTHER): Payer: Self-pay

## 2023-12-19 DIAGNOSIS — Z9884 Bariatric surgery status: Secondary | ICD-10-CM | POA: Diagnosis not present

## 2023-12-19 DIAGNOSIS — Z1331 Encounter for screening for depression: Secondary | ICD-10-CM | POA: Diagnosis not present

## 2023-12-19 DIAGNOSIS — I10 Essential (primary) hypertension: Secondary | ICD-10-CM | POA: Diagnosis not present

## 2023-12-19 DIAGNOSIS — M8589 Other specified disorders of bone density and structure, multiple sites: Secondary | ICD-10-CM | POA: Diagnosis not present

## 2023-12-19 DIAGNOSIS — R300 Dysuria: Secondary | ICD-10-CM | POA: Diagnosis not present

## 2023-12-19 DIAGNOSIS — R1012 Left upper quadrant pain: Secondary | ICD-10-CM | POA: Diagnosis not present

## 2023-12-19 DIAGNOSIS — K219 Gastro-esophageal reflux disease without esophagitis: Secondary | ICD-10-CM | POA: Diagnosis not present

## 2023-12-19 DIAGNOSIS — G5139 Clonic hemifacial spasm, unspecified: Secondary | ICD-10-CM | POA: Diagnosis not present

## 2023-12-19 DIAGNOSIS — Z0000 Encounter for general adult medical examination without abnormal findings: Secondary | ICD-10-CM | POA: Diagnosis not present

## 2023-12-19 DIAGNOSIS — E538 Deficiency of other specified B group vitamins: Secondary | ICD-10-CM | POA: Diagnosis not present

## 2023-12-19 DIAGNOSIS — E7800 Pure hypercholesterolemia, unspecified: Secondary | ICD-10-CM | POA: Diagnosis not present

## 2023-12-19 DIAGNOSIS — F411 Generalized anxiety disorder: Secondary | ICD-10-CM | POA: Diagnosis not present

## 2023-12-19 MED ORDER — CEPHALEXIN 500 MG PO CAPS
500.0000 mg | ORAL_CAPSULE | Freq: Two times a day (BID) | ORAL | 0 refills | Status: AC
  Filled 2023-12-19: qty 10, 5d supply, fill #0

## 2023-12-19 MED ORDER — PHENAZOPYRIDINE HCL 200 MG PO TABS
200.0000 mg | ORAL_TABLET | Freq: Three times a day (TID) | ORAL | 0 refills | Status: AC
  Filled 2023-12-19: qty 9, 3d supply, fill #0

## 2023-12-20 ENCOUNTER — Encounter: Payer: Self-pay | Admitting: Neurology

## 2023-12-21 DIAGNOSIS — N202 Calculus of kidney with calculus of ureter: Secondary | ICD-10-CM | POA: Diagnosis not present

## 2023-12-21 DIAGNOSIS — R300 Dysuria: Secondary | ICD-10-CM | POA: Diagnosis not present

## 2023-12-22 ENCOUNTER — Ambulatory Visit
Admission: RE | Admit: 2023-12-22 | Discharge: 2023-12-22 | Disposition: A | Discharge status: Home / Self Care | Payer: Medicare PPO | Source: Ambulatory Visit | Attending: Neurology | Admitting: Neurology

## 2023-12-22 DIAGNOSIS — G5132 Clonic hemifacial spasm, left: Secondary | ICD-10-CM | POA: Diagnosis not present

## 2023-12-22 DIAGNOSIS — R29810 Facial weakness: Secondary | ICD-10-CM

## 2023-12-22 DIAGNOSIS — G5131 Clonic hemifacial spasm, right: Secondary | ICD-10-CM

## 2023-12-22 MED ORDER — GADOPICLENOL 0.5 MMOL/ML IV SOLN
8.0000 mL | Freq: Once | INTRAVENOUS | Status: AC | PRN
  Administered 2023-12-22: 8 mL via INTRAVENOUS

## 2024-01-01 DIAGNOSIS — M5414 Radiculopathy, thoracic region: Secondary | ICD-10-CM | POA: Diagnosis not present

## 2024-01-02 ENCOUNTER — Telehealth: Payer: Self-pay | Admitting: Neurology

## 2024-01-02 DIAGNOSIS — R600 Localized edema: Secondary | ICD-10-CM | POA: Diagnosis not present

## 2024-01-02 DIAGNOSIS — Z1231 Encounter for screening mammogram for malignant neoplasm of breast: Secondary | ICD-10-CM | POA: Diagnosis not present

## 2024-01-02 NOTE — Telephone Encounter (Signed)
 Pt called in wanting to see if the MRI results were back yet? She is eager to find out if this will affect her next injection appointment.

## 2024-01-03 ENCOUNTER — Encounter: Payer: Self-pay | Admitting: Neurology

## 2024-01-03 DIAGNOSIS — J301 Allergic rhinitis due to pollen: Secondary | ICD-10-CM | POA: Diagnosis not present

## 2024-01-03 DIAGNOSIS — J3089 Other allergic rhinitis: Secondary | ICD-10-CM | POA: Diagnosis not present

## 2024-01-03 DIAGNOSIS — F411 Generalized anxiety disorder: Secondary | ICD-10-CM | POA: Diagnosis not present

## 2024-01-03 DIAGNOSIS — J3081 Allergic rhinitis due to animal (cat) (dog) hair and dander: Secondary | ICD-10-CM | POA: Diagnosis not present

## 2024-01-03 NOTE — Telephone Encounter (Signed)
 Called the reading room and spoke to Chamberlain. He has sent over the MRI to be resulted and read.

## 2024-01-08 DIAGNOSIS — J301 Allergic rhinitis due to pollen: Secondary | ICD-10-CM | POA: Diagnosis not present

## 2024-01-08 DIAGNOSIS — J3089 Other allergic rhinitis: Secondary | ICD-10-CM | POA: Diagnosis not present

## 2024-01-08 DIAGNOSIS — J3081 Allergic rhinitis due to animal (cat) (dog) hair and dander: Secondary | ICD-10-CM | POA: Diagnosis not present

## 2024-01-09 ENCOUNTER — Telehealth: Payer: Self-pay

## 2024-01-09 NOTE — Telephone Encounter (Signed)
 Patient is holing off on getting her injection after speaking with Dr. Arbutus Leas and is wanting to know if spasms start back can we call something in for her like Baclofen to help with those spasms. Patient also wants to know if she needs to make a follow up appointment for 6 months

## 2024-01-09 NOTE — Telephone Encounter (Signed)
 Called patient and she is making a 6 month appointment and she is pleased with being able to get help with Baclofen when she needs it

## 2024-01-14 DIAGNOSIS — J3089 Other allergic rhinitis: Secondary | ICD-10-CM | POA: Diagnosis not present

## 2024-01-14 DIAGNOSIS — J301 Allergic rhinitis due to pollen: Secondary | ICD-10-CM | POA: Diagnosis not present

## 2024-01-20 ENCOUNTER — Other Ambulatory Visit (HOSPITAL_BASED_OUTPATIENT_CLINIC_OR_DEPARTMENT_OTHER): Payer: Self-pay

## 2024-01-21 ENCOUNTER — Other Ambulatory Visit (HOSPITAL_BASED_OUTPATIENT_CLINIC_OR_DEPARTMENT_OTHER): Payer: Self-pay

## 2024-01-21 ENCOUNTER — Other Ambulatory Visit: Payer: Self-pay

## 2024-01-21 DIAGNOSIS — F411 Generalized anxiety disorder: Secondary | ICD-10-CM | POA: Diagnosis not present

## 2024-01-21 MED ORDER — TRAMADOL HCL 50 MG PO TABS
100.0000 mg | ORAL_TABLET | Freq: Three times a day (TID) | ORAL | 0 refills | Status: DC
  Filled 2024-01-21: qty 180, 30d supply, fill #0

## 2024-01-21 MED ORDER — LEVOCETIRIZINE DIHYDROCHLORIDE 5 MG PO TABS
5.0000 mg | ORAL_TABLET | Freq: Every evening | ORAL | 3 refills | Status: AC
  Filled 2024-01-21: qty 90, 90d supply, fill #0
  Filled 2024-06-10: qty 90, 90d supply, fill #1
  Filled 2024-08-26: qty 90, 90d supply, fill #2

## 2024-01-22 ENCOUNTER — Other Ambulatory Visit (HOSPITAL_BASED_OUTPATIENT_CLINIC_OR_DEPARTMENT_OTHER): Payer: Self-pay

## 2024-01-22 DIAGNOSIS — J3089 Other allergic rhinitis: Secondary | ICD-10-CM | POA: Diagnosis not present

## 2024-01-22 DIAGNOSIS — J301 Allergic rhinitis due to pollen: Secondary | ICD-10-CM | POA: Diagnosis not present

## 2024-01-22 DIAGNOSIS — J3081 Allergic rhinitis due to animal (cat) (dog) hair and dander: Secondary | ICD-10-CM | POA: Diagnosis not present

## 2024-01-23 ENCOUNTER — Other Ambulatory Visit: Payer: Self-pay

## 2024-01-24 ENCOUNTER — Other Ambulatory Visit (HOSPITAL_BASED_OUTPATIENT_CLINIC_OR_DEPARTMENT_OTHER): Payer: Self-pay

## 2024-01-24 DIAGNOSIS — J301 Allergic rhinitis due to pollen: Secondary | ICD-10-CM | POA: Diagnosis not present

## 2024-01-24 MED ORDER — MELOXICAM 15 MG PO TABS
15.0000 mg | ORAL_TABLET | Freq: Two times a day (BID) | ORAL | 1 refills | Status: AC
  Filled 2024-01-24: qty 180, 90d supply, fill #0
  Filled 2024-04-18: qty 180, 90d supply, fill #1

## 2024-01-25 ENCOUNTER — Ambulatory Visit: Payer: Medicare PPO | Admitting: Neurology

## 2024-01-28 DIAGNOSIS — M19071 Primary osteoarthritis, right ankle and foot: Secondary | ICD-10-CM | POA: Diagnosis not present

## 2024-02-06 DIAGNOSIS — R8271 Bacteriuria: Secondary | ICD-10-CM | POA: Diagnosis not present

## 2024-02-06 DIAGNOSIS — R300 Dysuria: Secondary | ICD-10-CM | POA: Diagnosis not present

## 2024-02-06 DIAGNOSIS — H903 Sensorineural hearing loss, bilateral: Secondary | ICD-10-CM | POA: Diagnosis not present

## 2024-02-07 DIAGNOSIS — J301 Allergic rhinitis due to pollen: Secondary | ICD-10-CM | POA: Diagnosis not present

## 2024-02-07 DIAGNOSIS — J3081 Allergic rhinitis due to animal (cat) (dog) hair and dander: Secondary | ICD-10-CM | POA: Diagnosis not present

## 2024-02-07 DIAGNOSIS — J3089 Other allergic rhinitis: Secondary | ICD-10-CM | POA: Diagnosis not present

## 2024-02-11 DIAGNOSIS — F411 Generalized anxiety disorder: Secondary | ICD-10-CM | POA: Diagnosis not present

## 2024-02-17 ENCOUNTER — Other Ambulatory Visit (HOSPITAL_BASED_OUTPATIENT_CLINIC_OR_DEPARTMENT_OTHER): Payer: Self-pay

## 2024-02-18 ENCOUNTER — Other Ambulatory Visit (HOSPITAL_BASED_OUTPATIENT_CLINIC_OR_DEPARTMENT_OTHER): Payer: Self-pay

## 2024-02-18 ENCOUNTER — Other Ambulatory Visit: Payer: Self-pay

## 2024-02-18 MED ORDER — TRAMADOL HCL 50 MG PO TABS
100.0000 mg | ORAL_TABLET | Freq: Three times a day (TID) | ORAL | 0 refills | Status: AC
Start: 2024-02-17 — End: ?
  Filled 2024-02-18: qty 180, 30d supply, fill #0

## 2024-02-21 ENCOUNTER — Other Ambulatory Visit (HOSPITAL_BASED_OUTPATIENT_CLINIC_OR_DEPARTMENT_OTHER): Payer: Self-pay

## 2024-02-28 ENCOUNTER — Other Ambulatory Visit (HOSPITAL_BASED_OUTPATIENT_CLINIC_OR_DEPARTMENT_OTHER): Payer: Self-pay

## 2024-02-28 DIAGNOSIS — J3081 Allergic rhinitis due to animal (cat) (dog) hair and dander: Secondary | ICD-10-CM | POA: Diagnosis not present

## 2024-02-28 DIAGNOSIS — J3089 Other allergic rhinitis: Secondary | ICD-10-CM | POA: Diagnosis not present

## 2024-02-28 DIAGNOSIS — J301 Allergic rhinitis due to pollen: Secondary | ICD-10-CM | POA: Diagnosis not present

## 2024-03-11 DIAGNOSIS — J3081 Allergic rhinitis due to animal (cat) (dog) hair and dander: Secondary | ICD-10-CM | POA: Diagnosis not present

## 2024-03-11 DIAGNOSIS — J301 Allergic rhinitis due to pollen: Secondary | ICD-10-CM | POA: Diagnosis not present

## 2024-03-11 DIAGNOSIS — J3089 Other allergic rhinitis: Secondary | ICD-10-CM | POA: Diagnosis not present

## 2024-03-12 DIAGNOSIS — F411 Generalized anxiety disorder: Secondary | ICD-10-CM | POA: Diagnosis not present

## 2024-03-13 ENCOUNTER — Other Ambulatory Visit (HOSPITAL_BASED_OUTPATIENT_CLINIC_OR_DEPARTMENT_OTHER): Payer: Self-pay

## 2024-03-13 ENCOUNTER — Other Ambulatory Visit: Payer: Self-pay

## 2024-03-13 MED ORDER — FELODIPINE ER 5 MG PO TB24
5.0000 mg | ORAL_TABLET | Freq: Every day | ORAL | 3 refills | Status: AC
  Filled 2024-03-13: qty 90, 90d supply, fill #0
  Filled 2024-06-10: qty 90, 90d supply, fill #1
  Filled 2024-09-09: qty 90, 90d supply, fill #2

## 2024-03-15 ENCOUNTER — Other Ambulatory Visit (HOSPITAL_BASED_OUTPATIENT_CLINIC_OR_DEPARTMENT_OTHER): Payer: Self-pay

## 2024-03-18 ENCOUNTER — Other Ambulatory Visit (HOSPITAL_BASED_OUTPATIENT_CLINIC_OR_DEPARTMENT_OTHER): Payer: Self-pay

## 2024-03-18 DIAGNOSIS — J301 Allergic rhinitis due to pollen: Secondary | ICD-10-CM | POA: Diagnosis not present

## 2024-03-18 DIAGNOSIS — J3081 Allergic rhinitis due to animal (cat) (dog) hair and dander: Secondary | ICD-10-CM | POA: Diagnosis not present

## 2024-03-18 DIAGNOSIS — J3089 Other allergic rhinitis: Secondary | ICD-10-CM | POA: Diagnosis not present

## 2024-03-19 ENCOUNTER — Other Ambulatory Visit (HOSPITAL_BASED_OUTPATIENT_CLINIC_OR_DEPARTMENT_OTHER): Payer: Self-pay

## 2024-03-19 MED ORDER — LORAZEPAM 0.5 MG PO TABS
0.5000 mg | ORAL_TABLET | Freq: Two times a day (BID) | ORAL | 2 refills | Status: DC | PRN
  Filled 2024-03-19 – 2024-03-31 (×6): qty 60, 30d supply, fill #0
  Filled 2024-04-28: qty 60, 30d supply, fill #1
  Filled 2024-06-12: qty 60, 30d supply, fill #2

## 2024-03-19 MED ORDER — TRAMADOL HCL 50 MG PO TABS
100.0000 mg | ORAL_TABLET | Freq: Three times a day (TID) | ORAL | 0 refills | Status: DC
  Filled 2024-03-19 – 2024-03-27 (×3): qty 180, 30d supply, fill #0

## 2024-03-20 ENCOUNTER — Other Ambulatory Visit (HOSPITAL_BASED_OUTPATIENT_CLINIC_OR_DEPARTMENT_OTHER): Payer: Self-pay

## 2024-03-24 ENCOUNTER — Other Ambulatory Visit (HOSPITAL_BASED_OUTPATIENT_CLINIC_OR_DEPARTMENT_OTHER): Payer: Self-pay

## 2024-03-25 ENCOUNTER — Other Ambulatory Visit (HOSPITAL_BASED_OUTPATIENT_CLINIC_OR_DEPARTMENT_OTHER): Payer: Self-pay

## 2024-03-25 DIAGNOSIS — J3081 Allergic rhinitis due to animal (cat) (dog) hair and dander: Secondary | ICD-10-CM | POA: Diagnosis not present

## 2024-03-25 DIAGNOSIS — J3089 Other allergic rhinitis: Secondary | ICD-10-CM | POA: Diagnosis not present

## 2024-03-25 DIAGNOSIS — J301 Allergic rhinitis due to pollen: Secondary | ICD-10-CM | POA: Diagnosis not present

## 2024-03-26 ENCOUNTER — Other Ambulatory Visit (HOSPITAL_BASED_OUTPATIENT_CLINIC_OR_DEPARTMENT_OTHER): Payer: Self-pay

## 2024-03-26 MED ORDER — GABAPENTIN 600 MG PO TABS
600.0000 mg | ORAL_TABLET | Freq: Three times a day (TID) | ORAL | 3 refills | Status: AC
  Filled 2024-03-26: qty 270, 90d supply, fill #0
  Filled 2024-07-18: qty 270, 90d supply, fill #1
  Filled 2024-10-12: qty 270, 90d supply, fill #2

## 2024-03-27 ENCOUNTER — Other Ambulatory Visit (HOSPITAL_BASED_OUTPATIENT_CLINIC_OR_DEPARTMENT_OTHER): Payer: Self-pay

## 2024-03-27 ENCOUNTER — Other Ambulatory Visit: Payer: Self-pay

## 2024-03-28 ENCOUNTER — Telehealth: Payer: Self-pay | Admitting: Neurology

## 2024-03-28 NOTE — Telephone Encounter (Signed)
 Pt.s Botox  injections have given spasms, would like Rx for spasms please call bk, no answrer pls leave message, [facility]

## 2024-03-29 ENCOUNTER — Other Ambulatory Visit (HOSPITAL_BASED_OUTPATIENT_CLINIC_OR_DEPARTMENT_OTHER): Payer: Self-pay

## 2024-03-31 ENCOUNTER — Encounter (HOSPITAL_BASED_OUTPATIENT_CLINIC_OR_DEPARTMENT_OTHER): Payer: Self-pay

## 2024-03-31 ENCOUNTER — Other Ambulatory Visit: Payer: Self-pay

## 2024-03-31 ENCOUNTER — Other Ambulatory Visit (HOSPITAL_BASED_OUTPATIENT_CLINIC_OR_DEPARTMENT_OTHER): Payer: Self-pay

## 2024-03-31 DIAGNOSIS — G5131 Clonic hemifacial spasm, right: Secondary | ICD-10-CM

## 2024-03-31 MED ORDER — BACLOFEN 10 MG PO TABS: 10.0000 mg | ORAL_TABLET | Freq: Every day | ORAL | 0 refills | Status: DC | PRN

## 2024-03-31 NOTE — Telephone Encounter (Signed)
 Cld Pt. Per Tat to sched Botox , Pt. stated had convo with Chelsea that she was to halt Botox  due to  -numbness -drooping lip But has got a lot better and Pt. Wanted to wait full 6 mnths without Botox  and did want to discuss using Muscle relaxer to treat if Tat thinks its needed as she is still having issues quivering. If she does not answer when called she may be out of town and would love a full message left

## 2024-03-31 NOTE — Telephone Encounter (Signed)
 RX sent

## 2024-04-09 DIAGNOSIS — F411 Generalized anxiety disorder: Secondary | ICD-10-CM | POA: Diagnosis not present

## 2024-04-10 DIAGNOSIS — J301 Allergic rhinitis due to pollen: Secondary | ICD-10-CM | POA: Diagnosis not present

## 2024-04-10 DIAGNOSIS — J3089 Other allergic rhinitis: Secondary | ICD-10-CM | POA: Diagnosis not present

## 2024-04-10 DIAGNOSIS — J3081 Allergic rhinitis due to animal (cat) (dog) hair and dander: Secondary | ICD-10-CM | POA: Diagnosis not present

## 2024-04-17 DIAGNOSIS — J3089 Other allergic rhinitis: Secondary | ICD-10-CM | POA: Diagnosis not present

## 2024-04-17 DIAGNOSIS — J3081 Allergic rhinitis due to animal (cat) (dog) hair and dander: Secondary | ICD-10-CM | POA: Diagnosis not present

## 2024-04-17 DIAGNOSIS — J301 Allergic rhinitis due to pollen: Secondary | ICD-10-CM | POA: Diagnosis not present

## 2024-04-18 ENCOUNTER — Other Ambulatory Visit (HOSPITAL_BASED_OUTPATIENT_CLINIC_OR_DEPARTMENT_OTHER): Payer: Self-pay

## 2024-04-21 ENCOUNTER — Other Ambulatory Visit: Payer: Self-pay

## 2024-04-21 ENCOUNTER — Other Ambulatory Visit (HOSPITAL_BASED_OUTPATIENT_CLINIC_OR_DEPARTMENT_OTHER): Payer: Self-pay

## 2024-04-21 MED ORDER — OMEPRAZOLE 40 MG PO CPDR
40.0000 mg | DELAYED_RELEASE_CAPSULE | Freq: Every day | ORAL | 1 refills | Status: DC
  Filled 2024-04-21: qty 180, 90d supply, fill #0
  Filled 2024-07-18: qty 180, 90d supply, fill #1

## 2024-04-28 ENCOUNTER — Other Ambulatory Visit (HOSPITAL_BASED_OUTPATIENT_CLINIC_OR_DEPARTMENT_OTHER): Payer: Self-pay

## 2024-04-29 ENCOUNTER — Other Ambulatory Visit (HOSPITAL_BASED_OUTPATIENT_CLINIC_OR_DEPARTMENT_OTHER): Payer: Self-pay

## 2024-04-29 ENCOUNTER — Other Ambulatory Visit: Payer: Self-pay

## 2024-04-29 MED ORDER — AZELASTINE HCL 137 MCG/SPRAY NA SOLN
1.0000 | Freq: Two times a day (BID) | NASAL | 2 refills | Status: AC
  Filled 2024-04-29: qty 90, 90d supply, fill #0
  Filled 2024-07-18: qty 90, 90d supply, fill #1

## 2024-04-30 ENCOUNTER — Other Ambulatory Visit: Payer: Self-pay

## 2024-04-30 ENCOUNTER — Other Ambulatory Visit (HOSPITAL_BASED_OUTPATIENT_CLINIC_OR_DEPARTMENT_OTHER): Payer: Self-pay

## 2024-04-30 MED ORDER — TRAMADOL HCL 50 MG PO TABS
100.0000 mg | ORAL_TABLET | Freq: Three times a day (TID) | ORAL | 0 refills | Status: DC
  Filled 2024-04-30: qty 180, 30d supply, fill #0

## 2024-04-30 MED ORDER — METHOCARBAMOL 750 MG PO TABS
750.0000 mg | ORAL_TABLET | Freq: Three times a day (TID) | ORAL | 3 refills | Status: AC | PRN
  Filled 2024-04-30: qty 30, 10d supply, fill #0
  Filled 2024-09-09: qty 30, 10d supply, fill #1

## 2024-05-01 ENCOUNTER — Other Ambulatory Visit (HOSPITAL_BASED_OUTPATIENT_CLINIC_OR_DEPARTMENT_OTHER): Payer: Self-pay

## 2024-05-06 DIAGNOSIS — M5414 Radiculopathy, thoracic region: Secondary | ICD-10-CM | POA: Diagnosis not present

## 2024-05-07 ENCOUNTER — Other Ambulatory Visit (HOSPITAL_BASED_OUTPATIENT_CLINIC_OR_DEPARTMENT_OTHER): Payer: Self-pay

## 2024-05-08 ENCOUNTER — Other Ambulatory Visit (HOSPITAL_BASED_OUTPATIENT_CLINIC_OR_DEPARTMENT_OTHER): Payer: Self-pay

## 2024-05-08 MED ORDER — LIDOCAINE 5 % EX PTCH
MEDICATED_PATCH | CUTANEOUS | 5 refills | Status: AC
  Filled 2024-05-08: qty 30, 30d supply, fill #0
  Filled 2024-06-10: qty 30, 30d supply, fill #1
  Filled 2024-07-18: qty 30, 30d supply, fill #2
  Filled 2024-08-08 – 2024-08-11 (×2): qty 30, 30d supply, fill #3
  Filled 2024-09-09: qty 30, 30d supply, fill #4
  Filled 2024-10-12: qty 30, 30d supply, fill #5

## 2024-05-12 DIAGNOSIS — J3081 Allergic rhinitis due to animal (cat) (dog) hair and dander: Secondary | ICD-10-CM | POA: Diagnosis not present

## 2024-05-12 DIAGNOSIS — J3089 Other allergic rhinitis: Secondary | ICD-10-CM | POA: Diagnosis not present

## 2024-05-12 DIAGNOSIS — J301 Allergic rhinitis due to pollen: Secondary | ICD-10-CM | POA: Diagnosis not present

## 2024-05-13 ENCOUNTER — Other Ambulatory Visit (HOSPITAL_BASED_OUTPATIENT_CLINIC_OR_DEPARTMENT_OTHER): Payer: Self-pay

## 2024-05-19 DIAGNOSIS — M19071 Primary osteoarthritis, right ankle and foot: Secondary | ICD-10-CM | POA: Diagnosis not present

## 2024-05-21 DIAGNOSIS — F411 Generalized anxiety disorder: Secondary | ICD-10-CM | POA: Diagnosis not present

## 2024-05-21 DIAGNOSIS — J3089 Other allergic rhinitis: Secondary | ICD-10-CM | POA: Diagnosis not present

## 2024-05-21 DIAGNOSIS — J3081 Allergic rhinitis due to animal (cat) (dog) hair and dander: Secondary | ICD-10-CM | POA: Diagnosis not present

## 2024-05-21 DIAGNOSIS — J301 Allergic rhinitis due to pollen: Secondary | ICD-10-CM | POA: Diagnosis not present

## 2024-05-27 ENCOUNTER — Other Ambulatory Visit (HOSPITAL_BASED_OUTPATIENT_CLINIC_OR_DEPARTMENT_OTHER): Payer: Self-pay

## 2024-05-27 DIAGNOSIS — M533 Sacrococcygeal disorders, not elsewhere classified: Secondary | ICD-10-CM | POA: Diagnosis not present

## 2024-05-27 MED ORDER — PREDNISONE 10 MG (21) PO TBPK
ORAL_TABLET | ORAL | 0 refills | Status: AC
  Filled 2024-05-27: qty 21, 6d supply, fill #0

## 2024-05-27 MED ORDER — FLUTICASONE PROPIONATE 50 MCG/ACT NA SUSP
1.0000 | Freq: Every day | NASAL | 3 refills | Status: AC
  Filled 2024-05-27: qty 48, 90d supply, fill #0
  Filled 2024-07-18 – 2024-08-11 (×4): qty 48, 90d supply, fill #1

## 2024-05-28 ENCOUNTER — Other Ambulatory Visit (HOSPITAL_BASED_OUTPATIENT_CLINIC_OR_DEPARTMENT_OTHER): Payer: Self-pay

## 2024-05-28 DIAGNOSIS — J301 Allergic rhinitis due to pollen: Secondary | ICD-10-CM | POA: Diagnosis not present

## 2024-05-28 DIAGNOSIS — J3081 Allergic rhinitis due to animal (cat) (dog) hair and dander: Secondary | ICD-10-CM | POA: Diagnosis not present

## 2024-05-28 DIAGNOSIS — J3089 Other allergic rhinitis: Secondary | ICD-10-CM | POA: Diagnosis not present

## 2024-05-28 MED ORDER — TRAMADOL HCL 50 MG PO TABS
100.0000 mg | ORAL_TABLET | Freq: Three times a day (TID) | ORAL | 0 refills | Status: DC
  Filled 2024-05-30: qty 180, 30d supply, fill #0

## 2024-05-29 ENCOUNTER — Other Ambulatory Visit (HOSPITAL_BASED_OUTPATIENT_CLINIC_OR_DEPARTMENT_OTHER): Payer: Self-pay

## 2024-05-29 DIAGNOSIS — N3000 Acute cystitis without hematuria: Secondary | ICD-10-CM | POA: Diagnosis not present

## 2024-05-29 DIAGNOSIS — N200 Calculus of kidney: Secondary | ICD-10-CM | POA: Diagnosis not present

## 2024-05-30 ENCOUNTER — Other Ambulatory Visit: Payer: Self-pay

## 2024-05-30 ENCOUNTER — Other Ambulatory Visit (HOSPITAL_BASED_OUTPATIENT_CLINIC_OR_DEPARTMENT_OTHER): Payer: Self-pay

## 2024-06-04 DIAGNOSIS — J301 Allergic rhinitis due to pollen: Secondary | ICD-10-CM | POA: Diagnosis not present

## 2024-06-04 DIAGNOSIS — J3081 Allergic rhinitis due to animal (cat) (dog) hair and dander: Secondary | ICD-10-CM | POA: Diagnosis not present

## 2024-06-04 DIAGNOSIS — J3089 Other allergic rhinitis: Secondary | ICD-10-CM | POA: Diagnosis not present

## 2024-06-10 ENCOUNTER — Other Ambulatory Visit: Payer: Self-pay

## 2024-06-10 ENCOUNTER — Other Ambulatory Visit (HOSPITAL_BASED_OUTPATIENT_CLINIC_OR_DEPARTMENT_OTHER): Payer: Self-pay

## 2024-06-12 ENCOUNTER — Other Ambulatory Visit (HOSPITAL_BASED_OUTPATIENT_CLINIC_OR_DEPARTMENT_OTHER): Payer: Self-pay

## 2024-06-13 ENCOUNTER — Other Ambulatory Visit (HOSPITAL_BASED_OUTPATIENT_CLINIC_OR_DEPARTMENT_OTHER): Payer: Self-pay

## 2024-06-13 DIAGNOSIS — I10 Essential (primary) hypertension: Secondary | ICD-10-CM | POA: Diagnosis not present

## 2024-06-13 DIAGNOSIS — J3089 Other allergic rhinitis: Secondary | ICD-10-CM | POA: Diagnosis not present

## 2024-06-13 DIAGNOSIS — J3081 Allergic rhinitis due to animal (cat) (dog) hair and dander: Secondary | ICD-10-CM | POA: Diagnosis not present

## 2024-06-13 DIAGNOSIS — K219 Gastro-esophageal reflux disease without esophagitis: Secondary | ICD-10-CM | POA: Diagnosis not present

## 2024-06-13 DIAGNOSIS — F411 Generalized anxiety disorder: Secondary | ICD-10-CM | POA: Diagnosis not present

## 2024-06-13 DIAGNOSIS — J301 Allergic rhinitis due to pollen: Secondary | ICD-10-CM | POA: Diagnosis not present

## 2024-06-13 MED ORDER — TRAMADOL HCL 50 MG PO TABS
100.0000 mg | ORAL_TABLET | Freq: Three times a day (TID) | ORAL | 0 refills | Status: DC
  Filled 2024-06-19: qty 180, 30d supply, fill #0

## 2024-06-17 ENCOUNTER — Other Ambulatory Visit (HOSPITAL_BASED_OUTPATIENT_CLINIC_OR_DEPARTMENT_OTHER): Payer: Self-pay

## 2024-06-18 DIAGNOSIS — M19071 Primary osteoarthritis, right ankle and foot: Secondary | ICD-10-CM | POA: Diagnosis not present

## 2024-06-19 ENCOUNTER — Other Ambulatory Visit (HOSPITAL_BASED_OUTPATIENT_CLINIC_OR_DEPARTMENT_OTHER): Payer: Self-pay

## 2024-06-19 DIAGNOSIS — J3089 Other allergic rhinitis: Secondary | ICD-10-CM | POA: Diagnosis not present

## 2024-06-19 DIAGNOSIS — J3081 Allergic rhinitis due to animal (cat) (dog) hair and dander: Secondary | ICD-10-CM | POA: Diagnosis not present

## 2024-06-19 DIAGNOSIS — J301 Allergic rhinitis due to pollen: Secondary | ICD-10-CM | POA: Diagnosis not present

## 2024-06-26 DIAGNOSIS — J3089 Other allergic rhinitis: Secondary | ICD-10-CM | POA: Diagnosis not present

## 2024-06-26 DIAGNOSIS — J3081 Allergic rhinitis due to animal (cat) (dog) hair and dander: Secondary | ICD-10-CM | POA: Diagnosis not present

## 2024-07-18 ENCOUNTER — Other Ambulatory Visit (HOSPITAL_BASED_OUTPATIENT_CLINIC_OR_DEPARTMENT_OTHER): Payer: Self-pay

## 2024-07-18 ENCOUNTER — Other Ambulatory Visit: Payer: Self-pay

## 2024-07-19 ENCOUNTER — Other Ambulatory Visit (HOSPITAL_BASED_OUTPATIENT_CLINIC_OR_DEPARTMENT_OTHER): Payer: Self-pay

## 2024-07-21 DIAGNOSIS — J3089 Other allergic rhinitis: Secondary | ICD-10-CM | POA: Diagnosis not present

## 2024-07-21 DIAGNOSIS — F411 Generalized anxiety disorder: Secondary | ICD-10-CM | POA: Diagnosis not present

## 2024-07-21 DIAGNOSIS — J301 Allergic rhinitis due to pollen: Secondary | ICD-10-CM | POA: Diagnosis not present

## 2024-07-21 DIAGNOSIS — J3081 Allergic rhinitis due to animal (cat) (dog) hair and dander: Secondary | ICD-10-CM | POA: Diagnosis not present

## 2024-07-25 ENCOUNTER — Other Ambulatory Visit (HOSPITAL_BASED_OUTPATIENT_CLINIC_OR_DEPARTMENT_OTHER): Payer: Self-pay

## 2024-07-26 ENCOUNTER — Other Ambulatory Visit (HOSPITAL_BASED_OUTPATIENT_CLINIC_OR_DEPARTMENT_OTHER): Payer: Self-pay

## 2024-07-26 MED ORDER — PREDNISONE 10 MG PO TABS
ORAL_TABLET | ORAL | 0 refills | Status: AC
  Filled 2024-07-26: qty 21, 6d supply, fill #0

## 2024-07-28 ENCOUNTER — Other Ambulatory Visit (HOSPITAL_BASED_OUTPATIENT_CLINIC_OR_DEPARTMENT_OTHER): Payer: Self-pay

## 2024-07-28 MED ORDER — ZOLPIDEM TARTRATE 10 MG PO TABS
10.0000 mg | ORAL_TABLET | Freq: Every evening | ORAL | 2 refills | Status: AC | PRN
  Filled 2024-07-28: qty 30, 30d supply, fill #0
  Filled 2024-09-24: qty 30, 30d supply, fill #1

## 2024-07-28 MED ORDER — LORAZEPAM 0.5 MG PO TABS
0.5000 mg | ORAL_TABLET | Freq: Two times a day (BID) | ORAL | 2 refills | Status: DC | PRN
  Filled 2024-07-28: qty 60, 30d supply, fill #0
  Filled 2024-08-26: qty 60, 30d supply, fill #1
  Filled 2024-09-24 – 2024-09-27 (×2): qty 60, 30d supply, fill #2

## 2024-07-28 MED ORDER — TRAMADOL HCL 50 MG PO TABS
100.0000 mg | ORAL_TABLET | Freq: Three times a day (TID) | ORAL | 0 refills | Status: DC
  Filled 2024-07-28: qty 180, 30d supply, fill #0

## 2024-07-30 ENCOUNTER — Other Ambulatory Visit (HOSPITAL_BASED_OUTPATIENT_CLINIC_OR_DEPARTMENT_OTHER): Payer: Self-pay

## 2024-07-31 ENCOUNTER — Other Ambulatory Visit (HOSPITAL_BASED_OUTPATIENT_CLINIC_OR_DEPARTMENT_OTHER): Payer: Self-pay

## 2024-07-31 ENCOUNTER — Other Ambulatory Visit: Payer: Self-pay

## 2024-07-31 MED ORDER — MELOXICAM 7.5 MG PO TABS
7.5000 mg | ORAL_TABLET | Freq: Two times a day (BID) | ORAL | 1 refills | Status: AC
  Filled 2024-07-31: qty 180, 90d supply, fill #0
  Filled 2024-10-14: qty 180, 90d supply, fill #1

## 2024-08-01 DIAGNOSIS — J301 Allergic rhinitis due to pollen: Secondary | ICD-10-CM | POA: Diagnosis not present

## 2024-08-01 DIAGNOSIS — J3089 Other allergic rhinitis: Secondary | ICD-10-CM | POA: Diagnosis not present

## 2024-08-01 DIAGNOSIS — J3081 Allergic rhinitis due to animal (cat) (dog) hair and dander: Secondary | ICD-10-CM | POA: Diagnosis not present

## 2024-08-07 DIAGNOSIS — J3089 Other allergic rhinitis: Secondary | ICD-10-CM | POA: Diagnosis not present

## 2024-08-07 DIAGNOSIS — J3081 Allergic rhinitis due to animal (cat) (dog) hair and dander: Secondary | ICD-10-CM | POA: Diagnosis not present

## 2024-08-07 DIAGNOSIS — J301 Allergic rhinitis due to pollen: Secondary | ICD-10-CM | POA: Diagnosis not present

## 2024-08-08 ENCOUNTER — Other Ambulatory Visit (HOSPITAL_BASED_OUTPATIENT_CLINIC_OR_DEPARTMENT_OTHER): Payer: Self-pay

## 2024-08-08 ENCOUNTER — Encounter (HOSPITAL_COMMUNITY): Payer: Self-pay | Admitting: Pharmacist

## 2024-08-08 MED ORDER — LISINOPRIL 10 MG PO TABS
10.0000 mg | ORAL_TABLET | Freq: Every day | ORAL | 3 refills | Status: AC
  Filled 2024-08-08: qty 90, 90d supply, fill #0

## 2024-08-12 ENCOUNTER — Other Ambulatory Visit (HOSPITAL_BASED_OUTPATIENT_CLINIC_OR_DEPARTMENT_OTHER): Payer: Self-pay

## 2024-08-14 DIAGNOSIS — M5416 Radiculopathy, lumbar region: Secondary | ICD-10-CM | POA: Diagnosis not present

## 2024-08-14 DIAGNOSIS — M5414 Radiculopathy, thoracic region: Secondary | ICD-10-CM | POA: Diagnosis not present

## 2024-08-22 DIAGNOSIS — J3089 Other allergic rhinitis: Secondary | ICD-10-CM | POA: Diagnosis not present

## 2024-08-22 DIAGNOSIS — J301 Allergic rhinitis due to pollen: Secondary | ICD-10-CM | POA: Diagnosis not present

## 2024-08-22 DIAGNOSIS — J3081 Allergic rhinitis due to animal (cat) (dog) hair and dander: Secondary | ICD-10-CM | POA: Diagnosis not present

## 2024-08-26 ENCOUNTER — Other Ambulatory Visit (HOSPITAL_BASED_OUTPATIENT_CLINIC_OR_DEPARTMENT_OTHER): Payer: Self-pay

## 2024-08-26 ENCOUNTER — Other Ambulatory Visit: Payer: Self-pay

## 2024-08-27 ENCOUNTER — Other Ambulatory Visit (HOSPITAL_BASED_OUTPATIENT_CLINIC_OR_DEPARTMENT_OTHER): Payer: Self-pay

## 2024-08-27 DIAGNOSIS — F411 Generalized anxiety disorder: Secondary | ICD-10-CM | POA: Diagnosis not present

## 2024-08-27 MED ORDER — TRAMADOL HCL 50 MG PO TABS
100.0000 mg | ORAL_TABLET | Freq: Three times a day (TID) | ORAL | 0 refills | Status: AC | PRN
  Filled 2024-08-27: qty 170, 29d supply, fill #0

## 2024-09-04 ENCOUNTER — Telehealth: Payer: Self-pay | Admitting: Neurology

## 2024-09-04 DIAGNOSIS — M5414 Radiculopathy, thoracic region: Secondary | ICD-10-CM | POA: Diagnosis not present

## 2024-09-04 DIAGNOSIS — J3081 Allergic rhinitis due to animal (cat) (dog) hair and dander: Secondary | ICD-10-CM | POA: Diagnosis not present

## 2024-09-04 DIAGNOSIS — G5131 Clonic hemifacial spasm, right: Secondary | ICD-10-CM

## 2024-09-04 DIAGNOSIS — J301 Allergic rhinitis due to pollen: Secondary | ICD-10-CM | POA: Diagnosis not present

## 2024-09-04 DIAGNOSIS — J3089 Other allergic rhinitis: Secondary | ICD-10-CM | POA: Diagnosis not present

## 2024-09-04 NOTE — Telephone Encounter (Signed)
 Nailani lvm returning call from [facility]: [phone]

## 2024-09-04 NOTE — Telephone Encounter (Signed)
 Pt is returning  [facility] call. Thanks

## 2024-09-04 NOTE — Telephone Encounter (Signed)
 Who's calling (name and relationship to patient) : [name]; Self  Best contact number: [phone]  Provider they see: Dr. Evonnie   Reason for call: [name] called in wanting to speak with [name]. She wanted to know if she needed to schedule a follow or would she be able to do the injection appt 1st.

## 2024-09-04 NOTE — Telephone Encounter (Signed)
Called and left patient voicemail message.

## 2024-09-05 MED ORDER — BACLOFEN 10 MG PO TABS: 10.0000 mg | ORAL_TABLET | Freq: Every day | ORAL | 0 refills | Status: AC | PRN

## 2024-09-05 NOTE — Telephone Encounter (Signed)
 Patient advised per Dr.Tat, You can refill the 20 pills, no refills.  Medication refilled and patient advised.

## 2024-09-05 NOTE — Telephone Encounter (Signed)
 Patient advised Per Dr.Tat, she do not need a follow up visit we can schedule her the Injection/Procedure visit.   We will give her a call once we get her medication approved.    Patient wanted to know while she waits can she get the muscle relaxer refilled while she waits?

## 2024-09-09 ENCOUNTER — Telehealth: Payer: Self-pay | Admitting: Neurology

## 2024-09-09 NOTE — Telephone Encounter (Signed)
 Pt called and stated she wants a call bk about a future appt that she said Chelsea knew about, I asked for more details and she declined

## 2024-09-10 ENCOUNTER — Other Ambulatory Visit: Payer: Self-pay

## 2024-09-10 ENCOUNTER — Telehealth: Payer: Self-pay

## 2024-09-10 ENCOUNTER — Other Ambulatory Visit: Payer: Self-pay | Admitting: Neurology

## 2024-09-10 ENCOUNTER — Other Ambulatory Visit (HOSPITAL_BASED_OUTPATIENT_CLINIC_OR_DEPARTMENT_OTHER): Payer: Self-pay

## 2024-09-10 DIAGNOSIS — G5131 Clonic hemifacial spasm, right: Secondary | ICD-10-CM

## 2024-09-10 MED ORDER — BOTOX 100 UNITS IJ SOLR: INTRAMUSCULAR | 2 refills | Status: DC

## 2024-09-10 NOTE — Telephone Encounter (Signed)
 Patient looking for a sooner appointment she says she is having severe spasms

## 2024-09-10 NOTE — Telephone Encounter (Signed)
 Patient needs a PA for Botox  100 units for [id number] Thank you

## 2024-09-11 ENCOUNTER — Other Ambulatory Visit: Payer: Self-pay

## 2024-09-11 ENCOUNTER — Other Ambulatory Visit (HOSPITAL_COMMUNITY): Payer: Self-pay

## 2024-09-11 ENCOUNTER — Telehealth: Payer: Self-pay | Admitting: Pharmacy Technician

## 2024-09-11 DIAGNOSIS — G5131 Clonic hemifacial spasm, right: Secondary | ICD-10-CM

## 2024-09-11 NOTE — Telephone Encounter (Signed)
 PA has been submitted, and telephone encounter has been created. Please see telephone encounter dated 12.11.25.

## 2024-09-11 NOTE — Telephone Encounter (Signed)
 Pharmacy Patient Advocate Encounter   Received notification from Pt Calls Messages that prior authorization for BOTOX  100 is required/requested.   Insurance verification completed.   The patient is insured through [employer] [place].   Per test claim: PA required; PA submitted to above mentioned insurance via Latent Key/confirmation #/EOC [id number] Status is pending

## 2024-09-12 ENCOUNTER — Other Ambulatory Visit (HOSPITAL_COMMUNITY): Payer: Self-pay

## 2024-09-12 ENCOUNTER — Other Ambulatory Visit (HOSPITAL_BASED_OUTPATIENT_CLINIC_OR_DEPARTMENT_OTHER): Payer: Self-pay

## 2024-09-12 MED ORDER — PREDNISONE 10 MG PO TABS
ORAL_TABLET | ORAL | 0 refills | Status: AC
  Filled 2024-09-12: qty 20, 8d supply, fill #0

## 2024-09-12 NOTE — Telephone Encounter (Signed)
 Pharmacy Patient Advocate Encounter  Received notification from [employer] that Prior Authorization for BOTOX  100 has been APPROVED from 1.1.25 to 12.31.26. Ran test claim, Copay is $128. This test claim was processed through [facility]- copay amounts may vary at other pharmacies due to pharmacy/plan contracts, or as the patient moves through the different stages of their insurance plan.   PA #/Case ID/Reference #: [id number]  Benefit Verification [id number] Submitted

## 2024-09-15 ENCOUNTER — Telehealth: Payer: Self-pay | Admitting: Neurology

## 2024-09-15 NOTE — Telephone Encounter (Signed)
 Polly from [facility] called,  she was trying to arrange delivery for Botox . Pt is not schedule for Botox  . Do we need to schedule Pt for a Botox  appointment. Thanks

## 2024-09-16 ENCOUNTER — Telehealth: Payer: Self-pay | Admitting: Neurology

## 2024-09-16 NOTE — Telephone Encounter (Signed)
 [facility] Call ID: [id number]  Polly: Pharmacy   [facility]: [phone]  Reason for the call: Caller states she is calling to scheduled delivery for Botox  for upcoming appointment.

## 2024-09-19 ENCOUNTER — Ambulatory Visit: Payer: Self-pay | Admitting: Neurology

## 2024-09-19 DIAGNOSIS — G245 Blepharospasm: Secondary | ICD-10-CM | POA: Diagnosis not present

## 2024-09-19 DIAGNOSIS — G5131 Clonic hemifacial spasm, right: Secondary | ICD-10-CM

## 2024-09-19 MED ORDER — ONABOTULINUMTOXINA 100 UNITS IJ SOLR
100.0000 [IU] | Freq: Once | INTRAMUSCULAR | Status: AC
  Administered 2024-09-19: 20 [IU] via INTRAMUSCULAR

## 2024-09-19 NOTE — Procedures (Signed)
 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  Stopped for long time and facial droop stayed same so she decided it wasn't from that.  However, sx's have come back and are quite bothersome and would like to proceed with re-injection   Consent obtained from: The patient Benefits discussed included, but were not limited to decreased muscle tightness, increased joint range of motion, and decreased pain.  Risk discussed included, but were not limited pain and discomfort, bleeding, increased ptosis, increased facial asymmetry, bruising, excessive weakness, venous thrombosis, muscle atrophy and dysphagia.  A copy of the patient medication guide was given to the patient which explains the blackbox warning.  Patients identity and treatment sites confirmed Yes.  .  Details of Procedure: Skin was cleaned with alcohol.   Prior to injection, the needle plunger was aspirated to make sure the needle was not within a blood vessel.  There was no blood retrieved on aspiration.    Following is a summary of the muscles injected  And the amount of Botulinum toxin used:  Injections  Location Left  Right Units Number of sites        Corrugator      Frontalis      Lower Lid, Lateral 2.5 2.5 5.0 1 each side  Lower Lid Medial 2.5  2.5 1  Upper Lid, Lateral 2.5 2.5 5.0 1 each side  Upper Lid, Medial      Canthus 5.0 2.5 7.5 1 each side  Temporalis      [place]      Procerus      Zygomaticus Major      TOTAL UNITS:   20    Agent: Botulinum Type A ( Onobotulinum Toxin type A ).  1 vials of Botox  were used, each containing 50 units and freshly diluted with 2 mL of sterile, non-perserved saline   Total injected (Units): 20  Total wasted (Units): 80 Pt tolerated procedure well; no complications Reinjection is anticipated in 3 months.

## 2024-09-19 NOTE — Addendum Note (Signed)
 Addended by: MERCER CREE C on: 09/19/2024 01:30 PM   Modules accepted: Orders

## 2024-09-19 NOTE — Addendum Note (Signed)
 Addended by: MERCER CREE C on: 09/19/2024 03:59 PM   Modules accepted: Orders

## 2024-09-19 NOTE — Addendum Note (Signed)
 Addended by: MERCER CREE C on: 09/19/2024 03:47 PM   Modules accepted: Orders

## 2024-09-24 ENCOUNTER — Other Ambulatory Visit (HOSPITAL_BASED_OUTPATIENT_CLINIC_OR_DEPARTMENT_OTHER): Payer: Self-pay

## 2024-09-26 ENCOUNTER — Other Ambulatory Visit (HOSPITAL_BASED_OUTPATIENT_CLINIC_OR_DEPARTMENT_OTHER): Payer: Self-pay

## 2024-09-26 ENCOUNTER — Other Ambulatory Visit: Payer: Self-pay

## 2024-09-26 MED ORDER — TRAMADOL HCL 50 MG PO TABS
100.0000 mg | ORAL_TABLET | Freq: Three times a day (TID) | ORAL | 0 refills | Status: DC
  Filled 2024-09-26: qty 180, 30d supply, fill #0

## 2024-09-29 ENCOUNTER — Other Ambulatory Visit: Payer: Self-pay

## 2024-10-06 ENCOUNTER — Other Ambulatory Visit (HOSPITAL_BASED_OUTPATIENT_CLINIC_OR_DEPARTMENT_OTHER): Payer: Self-pay

## 2024-10-12 ENCOUNTER — Other Ambulatory Visit (HOSPITAL_BASED_OUTPATIENT_CLINIC_OR_DEPARTMENT_OTHER): Payer: Self-pay

## 2024-10-12 ENCOUNTER — Other Ambulatory Visit: Payer: Self-pay

## 2024-10-13 ENCOUNTER — Other Ambulatory Visit (HOSPITAL_BASED_OUTPATIENT_CLINIC_OR_DEPARTMENT_OTHER): Payer: Self-pay

## 2024-10-13 ENCOUNTER — Other Ambulatory Visit: Payer: Self-pay

## 2024-10-13 MED ORDER — CLOBETASOL PROPIONATE 0.05 % EX FOAM
CUTANEOUS | 0 refills | Status: AC
  Filled 2024-10-13: qty 50, 30d supply, fill #0
  Filled 2024-11-05: qty 50, 30d supply, fill #1

## 2024-10-13 MED ORDER — OMEPRAZOLE 40 MG PO CPDR
DELAYED_RELEASE_CAPSULE | ORAL | 1 refills | Status: AC
  Filled 2024-10-13: qty 180, 90d supply, fill #0

## 2024-10-14 ENCOUNTER — Other Ambulatory Visit (HOSPITAL_BASED_OUTPATIENT_CLINIC_OR_DEPARTMENT_OTHER): Payer: Self-pay

## 2024-10-14 ENCOUNTER — Other Ambulatory Visit: Payer: Self-pay

## 2024-10-23 ENCOUNTER — Other Ambulatory Visit (HOSPITAL_BASED_OUTPATIENT_CLINIC_OR_DEPARTMENT_OTHER): Payer: Self-pay

## 2024-10-24 ENCOUNTER — Other Ambulatory Visit (HOSPITAL_BASED_OUTPATIENT_CLINIC_OR_DEPARTMENT_OTHER): Payer: Self-pay

## 2024-10-24 MED ORDER — TRAMADOL HCL 50 MG PO TABS
100.0000 mg | ORAL_TABLET | Freq: Three times a day (TID) | ORAL | 0 refills | Status: AC
  Filled 2024-10-24 – 2024-11-05 (×2): qty 180, 30d supply, fill #0

## 2024-10-25 ENCOUNTER — Other Ambulatory Visit (HOSPITAL_BASED_OUTPATIENT_CLINIC_OR_DEPARTMENT_OTHER): Payer: Self-pay

## 2024-10-30 ENCOUNTER — Other Ambulatory Visit (HOSPITAL_BASED_OUTPATIENT_CLINIC_OR_DEPARTMENT_OTHER): Payer: Self-pay

## 2024-10-30 MED ORDER — NITROFURANTOIN MONOHYD MACRO 100 MG PO CAPS
100.0000 mg | ORAL_CAPSULE | Freq: Two times a day (BID) | ORAL | 0 refills | Status: AC
  Filled 2024-10-30: qty 14, 7d supply, fill #0

## 2024-11-05 ENCOUNTER — Other Ambulatory Visit (HOSPITAL_BASED_OUTPATIENT_CLINIC_OR_DEPARTMENT_OTHER): Payer: Self-pay

## 2024-11-06 ENCOUNTER — Other Ambulatory Visit (HOSPITAL_BASED_OUTPATIENT_CLINIC_OR_DEPARTMENT_OTHER): Payer: Self-pay

## 2024-11-07 ENCOUNTER — Other Ambulatory Visit (HOSPITAL_BASED_OUTPATIENT_CLINIC_OR_DEPARTMENT_OTHER): Payer: Self-pay

## 2024-11-07 MED ORDER — LORAZEPAM 0.5 MG PO TABS
0.5000 mg | ORAL_TABLET | Freq: Two times a day (BID) | ORAL | 1 refills | Status: AC | PRN
  Filled 2024-11-07: qty 60, 30d supply, fill #0

## 2024-12-19 ENCOUNTER — Ambulatory Visit: Payer: Self-pay | Admitting: Neurology
